# Patient Record
Sex: Female | Born: 1980 | Race: White | Hispanic: No | Marital: Married | Smoking: Never smoker
Health system: Southern US, Community
[De-identification: ages and names within clinical notes are randomized; demographics above are authoritative.]

## PROBLEM LIST (undated history)

## (undated) DIAGNOSIS — IMO0002 Reserved for concepts with insufficient information to code with codable children: Secondary | ICD-10-CM

## (undated) DIAGNOSIS — F329 Major depressive disorder, single episode, unspecified: Secondary | ICD-10-CM

## (undated) DIAGNOSIS — T4145XA Adverse effect of unspecified anesthetic, initial encounter: Secondary | ICD-10-CM

## (undated) DIAGNOSIS — O09529 Supervision of elderly multigravida, unspecified trimester: Secondary | ICD-10-CM

## (undated) DIAGNOSIS — D649 Anemia, unspecified: Secondary | ICD-10-CM

## (undated) DIAGNOSIS — F32A Depression, unspecified: Secondary | ICD-10-CM

---
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
## 1998-03-09 ENCOUNTER — Inpatient Hospital Stay (HOSPITAL_COMMUNITY): Admission: AD | Admit: 1998-03-09 | Discharge: 1998-03-09 | Payer: Self-pay | Admitting: *Deleted

## 1998-03-14 DIAGNOSIS — T8859XA Other complications of anesthesia, initial encounter: Secondary | ICD-10-CM

## 1998-03-14 HISTORY — DX: Other complications of anesthesia, initial encounter: T88.59XA

## 1998-03-16 ENCOUNTER — Inpatient Hospital Stay (HOSPITAL_COMMUNITY): Admission: AD | Admit: 1998-03-16 | Discharge: 1998-03-16 | Payer: Self-pay | Admitting: Obstetrics

## 1998-03-17 ENCOUNTER — Inpatient Hospital Stay (HOSPITAL_COMMUNITY): Admission: AD | Admit: 1998-03-17 | Discharge: 1998-03-22 | Payer: Self-pay | Admitting: Obstetrics & Gynecology

## 1998-03-22 ENCOUNTER — Encounter (HOSPITAL_COMMUNITY): Admission: RE | Admit: 1998-03-22 | Discharge: 1998-06-20 | Payer: Self-pay | Admitting: Obstetrics & Gynecology

## 1998-03-26 ENCOUNTER — Inpatient Hospital Stay (HOSPITAL_COMMUNITY): Admission: AD | Admit: 1998-03-26 | Discharge: 1998-03-26 | Payer: Self-pay | Admitting: Obstetrics & Gynecology

## 2010-09-12 DIAGNOSIS — IMO0002 Reserved for concepts with insufficient information to code with codable children: Secondary | ICD-10-CM

## 2010-09-12 HISTORY — DX: Reserved for concepts with insufficient information to code with codable children: IMO0002

## 2011-03-28 LAB — OB RESULTS CONSOLE ANTIBODY SCREEN
ANTIBODY SCREEN: NEGATIVE
Antibody Screen: NEGATIVE

## 2011-03-28 LAB — OB RESULTS CONSOLE HEPATITIS B SURFACE ANTIGEN: Hepatitis B Surface Ag: NEGATIVE

## 2011-03-28 LAB — OB RESULTS CONSOLE ABO/RH: RH Type: POSITIVE

## 2011-03-28 LAB — OB RESULTS CONSOLE RUBELLA ANTIBODY, IGM: Rubella: IMMUNE

## 2011-10-01 ENCOUNTER — Encounter (HOSPITAL_COMMUNITY): Payer: Self-pay | Admitting: Pharmacist

## 2011-10-05 ENCOUNTER — Encounter (HOSPITAL_COMMUNITY): Payer: Self-pay

## 2011-10-06 ENCOUNTER — Encounter (HOSPITAL_COMMUNITY)
Admission: RE | Admit: 2011-10-06 | Discharge: 2011-10-06 | Disposition: A | Discharge status: Home / Self Care | Payer: Commercial Managed Care - PPO | Source: Ambulatory Visit | Attending: Obstetrics and Gynecology | Admitting: Obstetrics and Gynecology

## 2011-10-06 ENCOUNTER — Encounter (HOSPITAL_COMMUNITY): Payer: Self-pay

## 2011-10-06 HISTORY — DX: Adverse effect of unspecified anesthetic, initial encounter: T41.45XA

## 2011-10-06 HISTORY — DX: Anemia, unspecified: D64.9

## 2011-10-06 LAB — CBC
MCH: 28 pg (ref 26.0–34.0)
MCV: 83.2 fL (ref 78.0–100.0)
Platelets: 209 10*3/uL (ref 150–400)
RDW: 13.2 % (ref 11.5–15.5)

## 2011-10-06 LAB — TYPE AND SCREEN

## 2011-10-06 NOTE — Patient Instructions (Addendum)
Your procedure is scheduled on:10/10/11  Enter through the Main Entrance at :1130 am Pick up desk phone and dial [phone] and inform us of your arrival.  Please call [phone] if you have any problems the morning of surgery.  Remember: Do not eat after midnight:Sunday Do not drink after:9am on Monday  Take these meds the morning of surgery with a sip of water:none  DO NOT wear jewelry, eye make-up, lotion, or dark fingernail polish. Do not shave for 48 hours prior to surgery.  If you are to be admitted after surgery, leave suitcase in car until your room has been assigned. Patients discharged on the day of surgery will not be allowed to drive home.   Remember to use your Hibiclens as instructed.

## 2011-10-07 NOTE — H&P (Addendum)
31 yo G3P1011 @ 39 wks presents for rpt c-section.  Pregnancy uncomplicated.  Past histroy - see hollister All:  Quinolone drugs  AF, VSS Gen - NAD ABd - gravid, NT CV - RRR Lungs - clear bilaterlly PV - cvx closed  A/P:  Prior c-section, desires repeat R/b/a discussed, informed consent

## 2011-10-10 ENCOUNTER — Encounter (HOSPITAL_COMMUNITY): Payer: Self-pay | Admitting: *Deleted

## 2011-10-10 ENCOUNTER — Inpatient Hospital Stay (HOSPITAL_COMMUNITY): Payer: Commercial Managed Care - PPO

## 2011-10-10 ENCOUNTER — Inpatient Hospital Stay (HOSPITAL_COMMUNITY)
Admission: RE | Admit: 2011-10-10 | Discharge: 2011-10-13 | DRG: 766 | Disposition: A | Discharge status: Home / Self Care | Payer: Commercial Managed Care - PPO | Source: Ambulatory Visit | Attending: Obstetrics and Gynecology | Admitting: Obstetrics and Gynecology

## 2011-10-10 ENCOUNTER — Encounter (HOSPITAL_COMMUNITY)
Admission: RE | Disposition: A | Discharge status: Home / Self Care | Payer: Self-pay | Source: Ambulatory Visit | Attending: Obstetrics and Gynecology

## 2011-10-10 ENCOUNTER — Encounter (HOSPITAL_COMMUNITY): Payer: Self-pay

## 2011-10-10 ENCOUNTER — Encounter (HOSPITAL_COMMUNITY): Payer: Self-pay | Admitting: Anesthesiology

## 2011-10-10 DIAGNOSIS — O34219 Maternal care for unspecified type scar from previous cesarean delivery: Principal | ICD-10-CM | POA: Diagnosis present

## 2011-10-10 LAB — TYPE AND SCREEN: Antibody Screen: NEGATIVE

## 2011-10-10 SURGERY — Surgical Case
Anesthesia: Spinal | Site: Abdomen | Wound class: Clean Contaminated

## 2011-10-10 MED ORDER — SIMETHICONE 80 MG PO CHEW
80.0000 mg | CHEWABLE_TABLET | Freq: Three times a day (TID) | ORAL | Status: DC
  Administered 2011-10-10 – 2011-10-13 (×10): 80 mg via ORAL

## 2011-10-10 MED ORDER — SCOPOLAMINE 1 MG/3DAYS TD PT72
1.0000 | MEDICATED_PATCH | Freq: Once | TRANSDERMAL | Status: DC
  Administered 2011-10-10: 1.5 mg via TRANSDERMAL

## 2011-10-10 MED ORDER — OXYCODONE-ACETAMINOPHEN 5-325 MG PO TABS
1.0000 | ORAL_TABLET | ORAL | Status: DC | PRN
  Administered 2011-10-11: 2 via ORAL
  Administered 2011-10-11 – 2011-10-13 (×10): 1 via ORAL
  Filled 2011-10-10 (×9): qty 1
  Filled 2011-10-10: qty 2
  Filled 2011-10-10: qty 1

## 2011-10-10 MED ORDER — OXYTOCIN 10 UNIT/ML IJ SOLN
INTRAMUSCULAR | Status: AC
  Filled 2011-10-10: qty 4

## 2011-10-10 MED ORDER — KETOROLAC TROMETHAMINE 30 MG/ML IJ SOLN: 30.0000 mg | Freq: Four times a day (QID) | INTRAMUSCULAR | Status: AC | PRN

## 2011-10-10 MED ORDER — ONDANSETRON HCL 4 MG/2ML IJ SOLN
INTRAMUSCULAR | Status: AC
  Filled 2011-10-10: qty 2

## 2011-10-10 MED ORDER — MORPHINE SULFATE (PF) 0.5 MG/ML IJ SOLN
INTRAMUSCULAR | Status: DC | PRN
  Administered 2011-10-10: .1 mg via INTRATHECAL

## 2011-10-10 MED ORDER — OXYTOCIN 40 UNITS IN LACTATED RINGERS INFUSION - SIMPLE MED: 62.5000 mL/h | INTRAVENOUS | Status: AC

## 2011-10-10 MED ORDER — SENNOSIDES-DOCUSATE SODIUM 8.6-50 MG PO TABS
2.0000 | ORAL_TABLET | Freq: Every day | ORAL | Status: DC
  Administered 2011-10-10 – 2011-10-12 (×3): 2 via ORAL

## 2011-10-10 MED ORDER — MEDROXYPROGESTERONE ACETATE 150 MG/ML IM SUSP
150.0000 mg | INTRAMUSCULAR | Status: DC | PRN
Start: 2011-10-10 — End: 2011-10-13

## 2011-10-10 MED ORDER — DEXTROSE IN LACTATED RINGERS 5 % IV SOLN
INTRAVENOUS | Status: DC
  Administered 2011-10-10: 23:00:00 via INTRAVENOUS

## 2011-10-10 MED ORDER — SCOPOLAMINE 1 MG/3DAYS TD PT72
MEDICATED_PATCH | TRANSDERMAL | Status: AC
  Administered 2011-10-10: 1.5 mg via TRANSDERMAL
  Filled 2011-10-10: qty 1

## 2011-10-10 MED ORDER — DIPHENHYDRAMINE HCL 25 MG PO CAPS: 25.0000 mg | ORAL_CAPSULE | Freq: Four times a day (QID) | ORAL | Status: DC | PRN

## 2011-10-10 MED ORDER — SIMETHICONE 80 MG PO CHEW
80.0000 mg | CHEWABLE_TABLET | ORAL | Status: DC | PRN
  Administered 2011-10-11: 80 mg via ORAL

## 2011-10-10 MED ORDER — PHENYLEPHRINE HCL 10 MG/ML IJ SOLN
INTRAMUSCULAR | Status: DC | PRN
  Administered 2011-10-10: 40 ug via INTRAVENOUS
  Administered 2011-10-10: 120 ug via INTRAVENOUS

## 2011-10-10 MED ORDER — FENTANYL CITRATE 0.05 MG/ML IJ SOLN
INTRAMUSCULAR | Status: AC
  Filled 2011-10-10: qty 2

## 2011-10-10 MED ORDER — FENTANYL CITRATE 0.05 MG/ML IJ SOLN: 25.0000 ug | INTRAMUSCULAR | Status: DC | PRN

## 2011-10-10 MED ORDER — LACTATED RINGERS IV SOLN
INTRAVENOUS | Status: DC
  Administered 2011-10-10 (×2): via INTRAVENOUS

## 2011-10-10 MED ORDER — MIDAZOLAM HCL 2 MG/2ML IJ SOLN: 0.5000 mg | Freq: Once | INTRAMUSCULAR | Status: DC | PRN

## 2011-10-10 MED ORDER — MEPERIDINE HCL 25 MG/ML IJ SOLN: 6.2500 mg | INTRAMUSCULAR | Status: DC | PRN

## 2011-10-10 MED ORDER — IBUPROFEN 600 MG PO TABS
600.0000 mg | ORAL_TABLET | Freq: Four times a day (QID) | ORAL | Status: DC
  Administered 2011-10-11 – 2011-10-13 (×10): 600 mg via ORAL
  Filled 2011-10-10 (×10): qty 1

## 2011-10-10 MED ORDER — OXYTOCIN 10 UNIT/ML IJ SOLN
40.0000 [IU] | INTRAVENOUS | Status: DC | PRN
  Administered 2011-10-10: 40 [IU] via INTRAVENOUS

## 2011-10-10 MED ORDER — KETOROLAC TROMETHAMINE 30 MG/ML IJ SOLN
INTRAMUSCULAR | Status: AC
  Administered 2011-10-10: 30 mg via INTRAVENOUS
  Filled 2011-10-10: qty 1

## 2011-10-10 MED ORDER — DIPHENHYDRAMINE HCL 50 MG/ML IJ SOLN: 25.0000 mg | INTRAMUSCULAR | Status: DC | PRN

## 2011-10-10 MED ORDER — MENTHOL 3 MG MT LOZG: 1.0000 | LOZENGE | OROMUCOSAL | Status: DC | PRN

## 2011-10-10 MED ORDER — ONDANSETRON HCL 4 MG PO TABS
4.0000 mg | ORAL_TABLET | ORAL | Status: DC | PRN
  Administered 2011-10-11: 4 mg via ORAL
  Filled 2011-10-10: qty 1

## 2011-10-10 MED ORDER — SODIUM CHLORIDE 0.9 % IJ SOLN: 3.0000 mL | INTRAMUSCULAR | Status: DC | PRN

## 2011-10-10 MED ORDER — ACETAMINOPHEN 10 MG/ML IV SOLN: 1000.0000 mg | Freq: Four times a day (QID) | INTRAVENOUS | Status: AC | PRN

## 2011-10-10 MED ORDER — NALBUPHINE HCL 10 MG/ML IJ SOLN
5.0000 mg | INTRAMUSCULAR | Status: DC | PRN
  Administered 2011-10-10: 10 mg via SUBCUTANEOUS

## 2011-10-10 MED ORDER — DIBUCAINE 1 % RE OINT: 1.0000 "application " | TOPICAL_OINTMENT | RECTAL | Status: DC | PRN

## 2011-10-10 MED ORDER — NALOXONE HCL 0.4 MG/ML IJ SOLN: 0.4000 mg | INTRAMUSCULAR | Status: DC | PRN

## 2011-10-10 MED ORDER — PROMETHAZINE HCL 25 MG/ML IJ SOLN: 6.2500 mg | INTRAMUSCULAR | Status: DC | PRN

## 2011-10-10 MED ORDER — MEASLES, MUMPS & RUBELLA VAC ~~LOC~~ INJ: 0.5000 mL | INJECTION | Freq: Once | SUBCUTANEOUS | Status: DC

## 2011-10-10 MED ORDER — WITCH HAZEL-GLYCERIN EX PADS: 1.0000 "application " | MEDICATED_PAD | CUTANEOUS | Status: DC | PRN

## 2011-10-10 MED ORDER — PHENYLEPHRINE 40 MCG/ML (10ML) SYRINGE FOR IV PUSH (FOR BLOOD PRESSURE SUPPORT)
PREFILLED_SYRINGE | INTRAVENOUS | Status: AC
  Filled 2011-10-10: qty 5

## 2011-10-10 MED ORDER — MORPHINE SULFATE 0.5 MG/ML IJ SOLN
INTRAMUSCULAR | Status: AC
  Filled 2011-10-10: qty 10

## 2011-10-10 MED ORDER — METOCLOPRAMIDE HCL 5 MG/ML IJ SOLN: 10.0000 mg | Freq: Three times a day (TID) | INTRAMUSCULAR | Status: DC | PRN

## 2011-10-10 MED ORDER — TETANUS-DIPHTH-ACELL PERTUSSIS 5-2.5-18.5 LF-MCG/0.5 IM SUSP
0.5000 mL | Freq: Once | INTRAMUSCULAR | Status: AC
  Administered 2011-10-11: 0.5 mL via INTRAMUSCULAR
  Filled 2011-10-10: qty 0.5

## 2011-10-10 MED ORDER — LANOLIN HYDROUS EX OINT: 1.0000 "application " | TOPICAL_OINTMENT | CUTANEOUS | Status: DC | PRN

## 2011-10-10 MED ORDER — KETOROLAC TROMETHAMINE 30 MG/ML IJ SOLN
30.0000 mg | Freq: Four times a day (QID) | INTRAMUSCULAR | Status: AC | PRN
  Administered 2011-10-10 (×2): 30 mg via INTRAVENOUS
  Filled 2011-10-10: qty 1

## 2011-10-10 MED ORDER — FENTANYL CITRATE 0.05 MG/ML IJ SOLN
INTRAMUSCULAR | Status: DC | PRN
  Administered 2011-10-10: 25 ug via INTRATHECAL

## 2011-10-10 MED ORDER — CEFAZOLIN SODIUM-DEXTROSE 2-3 GM-% IV SOLR
INTRAVENOUS | Status: AC
  Administered 2011-10-10: 2 g via INTRAVENOUS
  Filled 2011-10-10: qty 50

## 2011-10-10 MED ORDER — DIPHENHYDRAMINE HCL 25 MG PO CAPS: 25.0000 mg | ORAL_CAPSULE | ORAL | Status: DC | PRN

## 2011-10-10 MED ORDER — NALBUPHINE HCL 10 MG/ML IJ SOLN
5.0000 mg | INTRAMUSCULAR | Status: DC | PRN
  Filled 2011-10-10: qty 1

## 2011-10-10 MED ORDER — SCOPOLAMINE 1 MG/3DAYS TD PT72: 1.0000 | MEDICATED_PATCH | Freq: Once | TRANSDERMAL | Status: DC

## 2011-10-10 MED ORDER — CEFAZOLIN SODIUM-DEXTROSE 2-3 GM-% IV SOLR: 2.0000 g | INTRAVENOUS | Status: DC

## 2011-10-10 MED ORDER — SCOPOLAMINE 1 MG/3DAYS TD PT72
MEDICATED_PATCH | TRANSDERMAL | Status: AC
  Filled 2011-10-10: qty 1

## 2011-10-10 MED ORDER — DIPHENHYDRAMINE HCL 50 MG/ML IJ SOLN: 12.5000 mg | INTRAMUSCULAR | Status: DC | PRN

## 2011-10-10 MED ORDER — ONDANSETRON HCL 4 MG/2ML IJ SOLN: 4.0000 mg | INTRAMUSCULAR | Status: DC | PRN

## 2011-10-10 MED ORDER — PRENATAL MULTIVITAMIN CH
1.0000 | ORAL_TABLET | Freq: Every day | ORAL | Status: DC
  Administered 2011-10-11 – 2011-10-13 (×3): 1 via ORAL
  Filled 2011-10-10 (×3): qty 1

## 2011-10-10 MED ORDER — SODIUM CHLORIDE 0.9 % IV SOLN: 1.0000 ug/kg/h | INTRAVENOUS | Status: DC | PRN

## 2011-10-10 MED ORDER — ONDANSETRON HCL 4 MG/2ML IJ SOLN: 4.0000 mg | Freq: Three times a day (TID) | INTRAMUSCULAR | Status: DC | PRN

## 2011-10-10 MED ORDER — ONDANSETRON HCL 4 MG/2ML IJ SOLN
INTRAMUSCULAR | Status: DC | PRN
  Administered 2011-10-10: 4 mg via INTRAVENOUS

## 2011-10-10 MED ORDER — BUPIVACAINE IN DEXTROSE 0.75-8.25 % IT SOLN
INTRATHECAL | Status: DC | PRN
  Administered 2011-10-10: 1.5 mL via INTRATHECAL

## 2011-10-10 SURGICAL SUPPLY — 27 items
CHLORAPREP W/TINT 26ML (MISCELLANEOUS) ×4 IMPLANT
CLOTH BEACON ORANGE TIMEOUT ST (SAFETY) ×2 IMPLANT
DERMABOND ADVANCED (GAUZE/BANDAGES/DRESSINGS) ×1
DERMABOND ADVANCED .7 DNX12 (GAUZE/BANDAGES/DRESSINGS) ×1 IMPLANT
ELECT REM PT RETURN 9FT ADLT (ELECTROSURGICAL) ×2
ELECTRODE REM PT RTRN 9FT ADLT (ELECTROSURGICAL) ×1 IMPLANT
EXTRACTOR VACUUM M CUP 4 TUBE (SUCTIONS) IMPLANT
GLOVE BIO SURGEON STRL SZ 6.5 (GLOVE) ×2 IMPLANT
GLOVE BIOGEL PI IND STRL 7.0 (GLOVE) ×2 IMPLANT
GLOVE BIOGEL PI INDICATOR 7.0 (GLOVE) ×2
GOWN PREVENTION PLUS LG XLONG (DISPOSABLE) ×6 IMPLANT
GOWN STRL REIN XL XLG (GOWN DISPOSABLE) ×2 IMPLANT
KIT ABG SYR 3ML LUER SLIP (SYRINGE) ×2 IMPLANT
NEEDLE HYPO 25X5/8 SAFETYGLIDE (NEEDLE) ×2 IMPLANT
NS IRRIG 1000ML POUR BTL (IV SOLUTION) ×2 IMPLANT
PACK C SECTION WH (CUSTOM PROCEDURE TRAY) ×2 IMPLANT
SLEEVE SCD COMPRESS KNEE MED (MISCELLANEOUS) IMPLANT
STAPLER VISISTAT 35W (STAPLE) IMPLANT
SUT CHROMIC 0 CT 802H (SUTURE) IMPLANT
SUT CHROMIC 0 CTX 36 (SUTURE) ×6 IMPLANT
SUT MON AB-0 CT1 36 (SUTURE) ×2 IMPLANT
SUT PDS AB 0 CTX 60 (SUTURE) ×2 IMPLANT
SUT PLAIN 0 NONE (SUTURE) IMPLANT
SUT VIC AB 4-0 KS 27 (SUTURE) ×2 IMPLANT
TOWEL OR 17X24 6PK STRL BLUE (TOWEL DISPOSABLE) ×4 IMPLANT
TRAY FOLEY CATH 14FR (SET/KITS/TRAYS/PACK) IMPLANT
WATER STERILE IRR 1000ML POUR (IV SOLUTION) ×2 IMPLANT

## 2011-10-10 NOTE — Anesthesia Postprocedure Evaluation (Signed)
Anesthesia Post Note  Patient: Jenna Chung  Procedure(s) Performed: Procedure(s) (LRB): CESAREAN SECTION (N/A)  Anesthesia type: Spinal  Patient location: PACU  Post pain: Pain level controlled  Post assessment: Post-op Vital signs reviewed  Last Vitals:  Filed Vitals:   10/10/11 1545  BP: 96/53  Pulse: 58  Temp:   Resp: 18    Post vital signs: Reviewed  Level of consciousness: awake  Complications: No apparent anesthesia complications

## 2011-10-10 NOTE — Transfer of Care (Signed)
Immediate Anesthesia Transfer of Care Note  Patient: Jenna Chung  Procedure(s) Performed: Procedure(s) (LRB): CESAREAN SECTION (N/A)  Patient Location: PACU  Anesthesia Type: Spinal  Level of Consciousness: awake  Airway & Oxygen Therapy: Patient Spontanous Breathing  Post-op Assessment: Post -op Vital signs reviewed and stable  Post vital signs: Reviewed  Complications: No apparent anesthesia complications

## 2011-10-10 NOTE — Anesthesia Preprocedure Evaluation (Signed)
Anesthesia Evaluation  Patient identified by MRN, date of birth, ID band Patient awake    Reviewed: Allergy & Precautions, H&P , NPO status , Patient's Chart, lab work & pertinent test results  Airway Mallampati: II      Dental No notable dental hx.    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     Plan Discussed with: Anesthesiologist, CRNA and Surgeon  Anesthesia Plan Comments:         Anesthesia Quick Evaluation

## 2011-10-10 NOTE — Anesthesia Procedure Notes (Signed)
Spinal  Patient location during procedure: OR Start time: 10/10/2011 1:22 PM Staffing Anesthesiologist: Brayton Caves R Performed by: anesthesiologist  Preanesthetic Checklist Completed: patient identified, site marked, surgical consent, pre-op evaluation, timeout performed, IV checked, risks and benefits discussed and monitors and equipment checked Spinal Block Patient position: sitting Prep: DuraPrep Patient monitoring: heart rate, cardiac monitor, continuous pulse ox and blood pressure Approach: midline Location: L3-4 Injection technique: single-shot Needle Needle type: Sprotte  Needle gauge: 24 G Needle length: 9 cm Assessment Sensory level: T4 Additional Notes Patient identified.  Risk benefits discussed including failed block, incomplete pain control, headache, nerve damage, paralysis, blood pressure changes, nausea, vomiting, reactions to medication both toxic or allergic, and postpartum back pain.  Patient expressed understanding and wished to proceed.  All questions were answered.  Sterile technique used throughout procedure.  CSF was clear.  No parasthesia or other complications.  Please see nursing notes for vital signs.

## 2011-10-11 ENCOUNTER — Encounter (HOSPITAL_COMMUNITY): Payer: Self-pay | Admitting: Obstetrics and Gynecology

## 2011-10-11 LAB — CBC
HCT: 28.5 % — ABNORMAL LOW (ref 36.0–46.0)
Hemoglobin: 9.6 g/dL — ABNORMAL LOW (ref 12.0–15.0)
RDW: 13.5 % (ref 11.5–15.5)
WBC: 10.5 10*3/uL (ref 4.0–10.5)

## 2011-10-11 NOTE — Op Note (Signed)
NAMETSERING, LEAMAN               ACCOUNT NO.:  [id number]  MEDICAL RECORD NO.:  [id number]  LOCATION:  9106                          FACILITY:  [facility]  PHYSICIAN:  Zelphia Cairo, MD    DATE OF BIRTH:  1981-02-18  DATE OF PROCEDURE: DATE OF DISCHARGE:                              OPERATIVE REPORT   PREOPERATIVE DIAGNOSES: 1. Prior cesarean section, desires repeat. 2. Intrauterine pregnancy at 39+ 4 weeks.  PROCEDURE:  Repeat low transverse cesarean delivery.  SURGEON:  Zelphia Cairo, MD  BLOOD LOSS:  600 mL.  URINE OUTPUT:  Clear.  FINDINGS:  Vigorous female infant with Apgars of 8 and 9.  COMPLICATIONS:  None.  CONDITION:  Stable to recovery room.  ANESTHESIA:  Spinal.  DESCRIPTION OF PROCEDURE:  The patient was taken to the operating room after informed consent was obtained.  She was placed in the supine position with a left tilt after the spinal was placed.  She was prepped and draped in sterile fashion and a Foley catheter was inserted. Pfannenstiel skin incision was made with a scalpel and extended to the underlying fascia.  The fascia was incised in the midline and extended laterally using curved Mayo scissors.  Kocher clamps were used to grasp the superior and inferior portion of the fascia.  The fascia was tented upwards and the underlying rectus muscles were dissected off using sharp and blunt dissection.  Peritoneum was then identified and entered sharply.  This was extended superiorly and inferiorly with good visualization of the bladder.  The bladder blade was then inserted. Vesicouterine peritoneum was dissected off the lower uterine segment using Metzenbaum scissors.  The bladder blade was then repositioned.  Uterine incision was made with a scalpel and extended bluntly using my fingers.  The fetal membranes were ruptured for clear fluid and fetal vertex was brought to the uterine incision.  The mouth and nose were suctioned.  Nuchal cord x1 was reduced.   The shoulders and body then delivered using fundal pressure.  The cord was clamped and cut and the infant was taken to the awaiting pediatric staff.  The placenta was then manually removed from the uterus.  The uterus was cleared of all clots and debris using a dry lap sponge.  The uterine incision was approximated using double-layer closure of 0 chromic in a running, locked fashion.  The pelvis was then copiously irrigated with saline. The fascia was reinspected and again found to be hemostatic.  The peritoneum was closed with 0 Monocryl. The fascia was closed with a loop 0 PDS.  The skin was closed with 4-0 Vicryl and Dermabond was placed over the skin incision.  Sponge lap, needle, and instrument counts were correct x2.  She was taken to the recovery room in stable condition.     Zelphia Cairo, MD     GA/MEDQ  D:  10/10/2011  T:  10/11/2011  Job:  [id number]

## 2011-10-11 NOTE — Progress Notes (Signed)
Subjective: Postpartum Day 1: Cesarean Delivery Patient reports tolerating PO and no problems voiding.    Objective: Vital signs in last 24 hours: Temp:  [97.4 F (36.3 C)-98.4 F (36.9 C)] 97.9 F (36.6 C) (07/30 9604) Pulse Rate:  [55-82] 68  (07/30 0608) Resp:  [14-20] 20  (07/30 0608) BP: (83-113)/(43-67) 96/60 mmHg (07/30 0608) SpO2:  [92 %-100 %] 98 % (07/30 0200) Weight:  [73.483 kg (162 lb)] 73.483 kg (162 lb) (07/29 1700)  Physical Exam:  General: alert and cooperative Lochia: appropriate Uterine Fundus: firm Incision: healing well DVT Evaluation: No evidence of DVT seen on physical exam.   Basename 10/11/11 0602  HGB 9.6*  HCT 28.5*    Assessment/Plan: Status post Cesarean section. Doing well postoperatively.  Continue current care.  Ting Cage G 10/11/2011, 7:55 AM

## 2011-10-11 NOTE — Addendum Note (Signed)
Addendum  created 10/11/11 1610 by Algis Greenhouse, CRNA   Modules edited:Notes Section

## 2011-10-11 NOTE — Anesthesia Postprocedure Evaluation (Signed)
Anesthesia Post Note  Patient: Jenna Chung  Procedure(s) Performed: Procedure(s) (LRB): CESAREAN SECTION (N/A)  Anesthesia type: Epidural  Patient location: Mother/Baby  Post pain: Pain level controlled  Post assessment: Post-op Vital signs reviewed  Last Vitals:  Filed Vitals:   10/11/11 0608  BP: 96/60  Pulse: 68  Temp: 36.6 C  Resp: 20    Post vital signs: Reviewed  Level of consciousness:alert  Complications: No apparent anesthesia complications

## 2011-10-12 NOTE — Progress Notes (Signed)
Patient encouraged to ambulate halls x3 and drink fluids. Pt not distended or uncomfortable.

## 2011-10-12 NOTE — Progress Notes (Signed)
Subjective: Postpartum Day 2: Cesarean Delivery Patient reports tolerating PO, + flatus and no problems voiding.    Objective: Vital signs in last 24 hours: Temp:  [97.8 F (36.6 C)-98.4 F (36.9 C)] 98.2 F (36.8 C) (07/31 0513) Pulse Rate:  [71-78] 76  (07/31 0513) Resp:  [18-20] 18  (07/31 0513) BP: (88-92)/(54-58) 92/54 mmHg (07/31 0513) SpO2:  [97 %-98 %] 98 % (07/30 1352)  Physical Exam:  General: alert and cooperative Lochia: appropriate Uterine Fundus: firm Incision: healing well DVT Evaluation: No evidence of DVT seen on physical exam.   Basename 10/11/11 0602  HGB 9.6*  HCT 28.5*    Assessment/Plan: Status post Cesarean section. Doing well postoperatively.  Continue current care.  CURTIS,CAROL G 10/12/2011, 7:57 AM

## 2011-10-13 MED ORDER — IBUPROFEN 600 MG PO TABS: 600.0000 mg | ORAL_TABLET | Freq: Four times a day (QID) | ORAL | Status: AC

## 2011-10-13 MED ORDER — OXYCODONE-ACETAMINOPHEN 5-325 MG PO TABS: 1.0000 | ORAL_TABLET | ORAL | Status: AC | PRN

## 2011-10-13 NOTE — Discharge Summary (Signed)
Obstetric Discharge Summary Reason for Admission: cesarean section Prenatal Procedures: ultrasound Intrapartum Procedures: cesarean: low cervical, transverse Postpartum Procedures: none Complications-Operative and Postpartum: none Hemoglobin  Date Value Range Status  10/11/2011 9.6* 12.0 - 15.0 g/dL Final     HCT  Date Value Range Status  10/11/2011 28.5* 36.0 - 46.0 % Final    Physical Exam:  General: alert and cooperative Lochia: appropriate Uterine Fundus: firm Incision: healing well DVT Evaluation: No evidence of DVT seen on physical exam.  Discharge Diagnoses: Term Pregnancy-delivered  Discharge Information: Date: 10/13/2011 Activity: pelvic rest Diet: routine Medications: PNV, Ibuprofen and Percocet Condition: stable Instructions: refer to practice specific booklet Discharge to: home   Newborn Data: Live born female  Birth Weight: 7 lb 8.3 oz (3410 g) APGAR: 8, 9  Home with mother.  Eyad Rochford G 10/13/2011, 8:05 AM

## 2011-12-29 ENCOUNTER — Encounter (HOSPITAL_COMMUNITY)
Admission: RE | Admit: 2011-12-29 | Discharge: 2011-12-29 | Disposition: A | Discharge status: Home / Self Care | Payer: Commercial Managed Care - PPO | Source: Ambulatory Visit | Attending: Obstetrics and Gynecology | Admitting: Obstetrics and Gynecology

## 2011-12-29 DIAGNOSIS — O923 Agalactia: Secondary | ICD-10-CM | POA: Insufficient documentation

## 2012-01-29 ENCOUNTER — Encounter (HOSPITAL_COMMUNITY)
Admission: RE | Admit: 2012-01-29 | Discharge: 2012-01-29 | Disposition: A | Discharge status: Home / Self Care | Payer: Commercial Managed Care - PPO | Source: Ambulatory Visit | Attending: Obstetrics and Gynecology | Admitting: Obstetrics and Gynecology

## 2012-01-29 DIAGNOSIS — O923 Agalactia: Secondary | ICD-10-CM | POA: Insufficient documentation

## 2012-02-28 ENCOUNTER — Encounter (HOSPITAL_COMMUNITY)
Admission: RE | Admit: 2012-02-28 | Discharge: 2012-02-28 | Disposition: A | Discharge status: Home / Self Care | Payer: Commercial Managed Care - PPO | Source: Ambulatory Visit | Attending: Obstetrics and Gynecology | Admitting: Obstetrics and Gynecology

## 2012-02-28 DIAGNOSIS — O923 Agalactia: Secondary | ICD-10-CM | POA: Insufficient documentation

## 2012-03-30 ENCOUNTER — Encounter (HOSPITAL_COMMUNITY)
Admission: RE | Admit: 2012-03-30 | Discharge: 2012-03-30 | Disposition: A | Discharge status: Home / Self Care | Payer: Commercial Managed Care - PPO | Source: Ambulatory Visit | Attending: Obstetrics and Gynecology | Admitting: Obstetrics and Gynecology

## 2012-03-30 DIAGNOSIS — O923 Agalactia: Secondary | ICD-10-CM | POA: Insufficient documentation

## 2012-04-30 ENCOUNTER — Encounter (HOSPITAL_COMMUNITY)
Admission: RE | Admit: 2012-04-30 | Discharge: 2012-04-30 | Disposition: A | Discharge status: Home / Self Care | Payer: Commercial Managed Care - PPO | Source: Ambulatory Visit | Attending: Obstetrics and Gynecology | Admitting: Obstetrics and Gynecology

## 2012-04-30 DIAGNOSIS — O923 Agalactia: Secondary | ICD-10-CM | POA: Insufficient documentation

## 2012-05-29 ENCOUNTER — Encounter (HOSPITAL_COMMUNITY)
Admission: RE | Admit: 2012-05-29 | Discharge: 2012-05-29 | Disposition: A | Discharge status: Home / Self Care | Payer: Commercial Managed Care - PPO | Source: Ambulatory Visit | Attending: Obstetrics and Gynecology | Admitting: Obstetrics and Gynecology

## 2012-05-29 DIAGNOSIS — O923 Agalactia: Secondary | ICD-10-CM | POA: Insufficient documentation

## 2012-06-29 ENCOUNTER — Encounter (HOSPITAL_COMMUNITY)
Admission: RE | Admit: 2012-06-29 | Discharge: 2012-06-29 | Disposition: A | Discharge status: Home / Self Care | Payer: Commercial Managed Care - PPO | Source: Ambulatory Visit | Attending: Obstetrics and Gynecology | Admitting: Obstetrics and Gynecology

## 2012-06-29 DIAGNOSIS — O923 Agalactia: Secondary | ICD-10-CM | POA: Insufficient documentation

## 2012-07-30 ENCOUNTER — Encounter (HOSPITAL_COMMUNITY)
Admission: RE | Admit: 2012-07-30 | Discharge: 2012-07-30 | Disposition: A | Discharge status: Home / Self Care | Payer: Commercial Managed Care - PPO | Source: Ambulatory Visit | Attending: Obstetrics and Gynecology | Admitting: Obstetrics and Gynecology

## 2012-07-30 DIAGNOSIS — O923 Agalactia: Secondary | ICD-10-CM | POA: Insufficient documentation

## 2012-08-30 ENCOUNTER — Encounter (HOSPITAL_COMMUNITY)
Admission: RE | Admit: 2012-08-30 | Discharge: 2012-08-30 | Disposition: A | Discharge status: Home / Self Care | Payer: Commercial Managed Care - PPO | Source: Ambulatory Visit | Attending: Obstetrics and Gynecology | Admitting: Obstetrics and Gynecology

## 2012-08-30 DIAGNOSIS — O923 Agalactia: Secondary | ICD-10-CM | POA: Insufficient documentation

## 2012-09-29 ENCOUNTER — Encounter (HOSPITAL_COMMUNITY)
Admission: RE | Admit: 2012-09-29 | Discharge: 2012-09-29 | Disposition: A | Discharge status: Home / Self Care | Payer: Commercial Managed Care - PPO | Source: Ambulatory Visit | Attending: Obstetrics and Gynecology | Admitting: Obstetrics and Gynecology

## 2012-09-29 DIAGNOSIS — O923 Agalactia: Secondary | ICD-10-CM | POA: Insufficient documentation

## 2012-10-30 ENCOUNTER — Encounter (HOSPITAL_COMMUNITY)
Admission: RE | Admit: 2012-10-30 | Discharge: 2012-10-30 | Disposition: A | Discharge status: Home / Self Care | Payer: Commercial Managed Care - PPO | Source: Ambulatory Visit | Attending: Obstetrics and Gynecology | Admitting: Obstetrics and Gynecology

## 2012-10-30 DIAGNOSIS — O923 Agalactia: Secondary | ICD-10-CM | POA: Insufficient documentation

## 2012-11-30 ENCOUNTER — Encounter (HOSPITAL_COMMUNITY)
Admission: RE | Admit: 2012-11-30 | Discharge: 2012-11-30 | Disposition: A | Discharge status: Home / Self Care | Payer: Commercial Managed Care - PPO | Source: Ambulatory Visit | Attending: Obstetrics and Gynecology | Admitting: Obstetrics and Gynecology

## 2012-11-30 DIAGNOSIS — O923 Agalactia: Secondary | ICD-10-CM | POA: Insufficient documentation

## 2012-12-31 ENCOUNTER — Encounter (HOSPITAL_COMMUNITY)
Admission: RE | Admit: 2012-12-31 | Discharge: 2012-12-31 | Disposition: A | Discharge status: Home / Self Care | Payer: Commercial Managed Care - PPO | Source: Ambulatory Visit | Attending: Obstetrics and Gynecology | Admitting: Obstetrics and Gynecology

## 2012-12-31 DIAGNOSIS — O923 Agalactia: Secondary | ICD-10-CM | POA: Insufficient documentation

## 2013-01-31 ENCOUNTER — Encounter (HOSPITAL_COMMUNITY)
Admission: RE | Admit: 2013-01-31 | Discharge: 2013-01-31 | Disposition: A | Discharge status: Home / Self Care | Payer: Commercial Managed Care - PPO | Source: Ambulatory Visit | Attending: Obstetrics and Gynecology | Admitting: Obstetrics and Gynecology

## 2013-01-31 DIAGNOSIS — O923 Agalactia: Secondary | ICD-10-CM | POA: Insufficient documentation

## 2013-02-19 ENCOUNTER — Other Ambulatory Visit: Payer: Self-pay | Admitting: Obstetrics and Gynecology

## 2013-02-19 DIAGNOSIS — N63 Unspecified lump in unspecified breast: Secondary | ICD-10-CM

## 2013-02-20 ENCOUNTER — Other Ambulatory Visit: Payer: Self-pay | Admitting: Obstetrics and Gynecology

## 2013-02-20 DIAGNOSIS — N6322 Unspecified lump in the left breast, upper inner quadrant: Secondary | ICD-10-CM

## 2013-02-28 ENCOUNTER — Other Ambulatory Visit: Payer: Commercial Managed Care - PPO

## 2013-03-01 ENCOUNTER — Other Ambulatory Visit: Payer: Self-pay | Admitting: Obstetrics and Gynecology

## 2013-03-01 ENCOUNTER — Ambulatory Visit
Admission: RE | Admit: 2013-03-01 | Discharge: 2013-03-01 | Disposition: A | Discharge status: Home / Self Care | Payer: Commercial Managed Care - PPO | Source: Ambulatory Visit | Attending: Obstetrics and Gynecology | Admitting: Obstetrics and Gynecology

## 2013-03-01 DIAGNOSIS — N6322 Unspecified lump in the left breast, upper inner quadrant: Secondary | ICD-10-CM

## 2013-03-03 ENCOUNTER — Encounter (HOSPITAL_COMMUNITY)
Admission: RE | Admit: 2013-03-03 | Discharge: 2013-03-03 | Disposition: A | Discharge status: Home / Self Care | Payer: Commercial Managed Care - PPO | Source: Ambulatory Visit | Attending: Obstetrics and Gynecology | Admitting: Obstetrics and Gynecology

## 2013-03-03 DIAGNOSIS — O923 Agalactia: Secondary | ICD-10-CM | POA: Insufficient documentation

## 2013-03-11 LAB — OB RESULTS CONSOLE RUBELLA ANTIBODY, IGM: Rubella: IMMUNE

## 2013-03-11 LAB — OB RESULTS CONSOLE HEPATITIS B SURFACE ANTIGEN: HEP B S AG: NEGATIVE

## 2013-03-11 LAB — OB RESULTS CONSOLE ABO/RH: RH TYPE: POSITIVE

## 2013-03-11 LAB — OB RESULTS CONSOLE HIV ANTIBODY (ROUTINE TESTING): HIV: NONREACTIVE

## 2013-03-11 LAB — OB RESULTS CONSOLE RPR: RPR: NONREACTIVE

## 2013-04-03 ENCOUNTER — Encounter (HOSPITAL_COMMUNITY)
Admission: RE | Admit: 2013-04-03 | Discharge: 2013-04-03 | Disposition: A | Discharge status: Home / Self Care | Payer: PRIVATE HEALTH INSURANCE | Source: Ambulatory Visit | Attending: Obstetrics and Gynecology | Admitting: Obstetrics and Gynecology

## 2013-04-03 DIAGNOSIS — O923 Agalactia: Secondary | ICD-10-CM | POA: Insufficient documentation

## 2013-05-04 ENCOUNTER — Encounter (HOSPITAL_COMMUNITY)
Admission: RE | Admit: 2013-05-04 | Discharge: 2013-05-04 | Disposition: A | Discharge status: Home / Self Care | Payer: PRIVATE HEALTH INSURANCE | Source: Ambulatory Visit | Attending: Obstetrics and Gynecology | Admitting: Obstetrics and Gynecology

## 2013-05-04 DIAGNOSIS — O923 Agalactia: Secondary | ICD-10-CM | POA: Insufficient documentation

## 2013-06-03 ENCOUNTER — Encounter (HOSPITAL_COMMUNITY)
Admission: RE | Admit: 2013-06-03 | Discharge: 2013-06-03 | Disposition: A | Discharge status: Home / Self Care | Payer: PRIVATE HEALTH INSURANCE | Source: Ambulatory Visit | Attending: Obstetrics and Gynecology | Admitting: Obstetrics and Gynecology

## 2013-06-03 DIAGNOSIS — O923 Agalactia: Secondary | ICD-10-CM | POA: Insufficient documentation

## 2013-07-04 ENCOUNTER — Encounter (HOSPITAL_COMMUNITY)
Admission: RE | Admit: 2013-07-04 | Discharge: 2013-07-04 | Disposition: A | Discharge status: Home / Self Care | Payer: PRIVATE HEALTH INSURANCE | Source: Ambulatory Visit | Attending: Obstetrics and Gynecology | Admitting: Obstetrics and Gynecology

## 2013-07-04 DIAGNOSIS — O923 Agalactia: Secondary | ICD-10-CM | POA: Insufficient documentation

## 2013-07-19 LAB — OB RESULTS CONSOLE GBS: STREP GROUP B AG: POSITIVE

## 2013-08-03 ENCOUNTER — Encounter (HOSPITAL_COMMUNITY)
Admission: RE | Admit: 2013-08-03 | Discharge: 2013-08-03 | Disposition: A | Discharge status: Home / Self Care | Payer: PRIVATE HEALTH INSURANCE | Source: Ambulatory Visit | Attending: Obstetrics and Gynecology | Admitting: Obstetrics and Gynecology

## 2013-08-03 DIAGNOSIS — O923 Agalactia: Secondary | ICD-10-CM | POA: Insufficient documentation

## 2013-08-13 ENCOUNTER — Encounter (HOSPITAL_COMMUNITY): Payer: Self-pay | Admitting: Pharmacy Technician

## 2013-08-22 ENCOUNTER — Encounter (HOSPITAL_COMMUNITY): Payer: Self-pay | Admitting: *Deleted

## 2013-08-22 ENCOUNTER — Encounter (HOSPITAL_COMMUNITY): Payer: Self-pay

## 2013-08-22 ENCOUNTER — Encounter (HOSPITAL_COMMUNITY)
Admission: RE | Admit: 2013-08-22 | Discharge: 2013-08-22 | Disposition: A | Discharge status: Home / Self Care | Payer: PRIVATE HEALTH INSURANCE | Source: Ambulatory Visit | Attending: Obstetrics and Gynecology | Admitting: Obstetrics and Gynecology

## 2013-08-22 LAB — CBC
HEMATOCRIT: 31.8 % — AB (ref 36.0–46.0)
Hemoglobin: 10.6 g/dL — ABNORMAL LOW (ref 12.0–15.0)
MCH: 28.6 pg (ref 26.0–34.0)
MCHC: 33.3 g/dL (ref 30.0–36.0)
MCV: 85.7 fL (ref 78.0–100.0)
Platelets: 224 10*3/uL (ref 150–400)
RBC: 3.71 MIL/uL — ABNORMAL LOW (ref 3.87–5.11)
RDW: 13.4 % (ref 11.5–15.5)
WBC: 8.2 10*3/uL (ref 4.0–10.5)

## 2013-08-22 LAB — TYPE AND SCREEN
ABO/RH(D): A POS
Antibody Screen: NEGATIVE

## 2013-08-22 NOTE — H&P (Addendum)
Jenna Chung is a 33 y.o. female presenting for repeat c-section. History OB History   Grav Para Term Preterm Abortions TAB SAB Ect Mult Living   3 2 2  1     2      Past Medical History  Diagnosis Date  . Anemia   . 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 Cesarean section    . Cesarean section  10/10/2011    Procedure: CESAREAN SECTION;  Surgeon: Zelphia Cairo, MD;  Location: [facility];  Service: Gynecology;  Laterality: N/A;  Repeat edc 10/13/11   Family History: family history is not on file. Social History:  reports that she has never smoked. She does not have any smokeless tobacco history on file. She reports that she does not drink alcohol or use illicit drugs.   Prenatal Transfer Tool  Maternal Diabetes: No Genetic Screening: Normal Maternal Ultrasounds/Referrals: Normal Fetal Ultrasounds or other Referrals:  None Maternal Substance Abuse:  No Significant Maternal Medications:  None Significant Maternal Lab Results:  None Other Comments:  None  ROS    Exam Physical Exam  Gen - NAD CV - RRR Lungs - clear Abd - gravid, NT Ext - NT PV - cvx exam deferred Prenatal labs: ABO, Rh:   Antibody:   Rubella:   RPR:    HBsAg:    HIV:    GBS:     Assessment/Plan: Previous c-section x2 Repeat c-section R/b/a discussed, questions answered, informed consent   Saryn Cherry 08/22/2013, 7:22 AM

## 2013-08-22 NOTE — Patient Instructions (Addendum)
   Your procedure is scheduled on:  Friday, June 12  Enter through the [employer] [facility] at: 6:30 AM Pick up the phone at the desk and dial [phone] and inform us of your arrival.  Please call this number if you have any problems the morning of surgery: [phone]  Remember: Do not eat or drink after midnight: Thursday Take these medicines the morning of surgery with a SIP OF WATER:  None  Do not wear jewelry, make-up, or FINGER nail polish No metal in your hair or on your body. Do not wear lotions, powders, perfumes.  You may wear deodorant.  Do not bring valuables to the hospital. Contacts, dentures or bridgework may not be worn into surgery.  Leave suitcase in the car. After Surgery it may be brought to your room. For patients being admitted to the hospital, checkout time is 11:00am the day of discharge.  Home with husband Thayer Ohm cell [phone].

## 2013-08-23 ENCOUNTER — Encounter (HOSPITAL_COMMUNITY)
Admission: RE | Disposition: A | Discharge status: Home / Self Care | Payer: Self-pay | Source: Ambulatory Visit | Attending: Obstetrics and Gynecology

## 2013-08-23 ENCOUNTER — Encounter (HOSPITAL_COMMUNITY): Payer: Self-pay | Admitting: General Practice

## 2013-08-23 ENCOUNTER — Inpatient Hospital Stay (HOSPITAL_COMMUNITY): Payer: PRIVATE HEALTH INSURANCE | Admitting: Anesthesiology

## 2013-08-23 ENCOUNTER — Inpatient Hospital Stay (HOSPITAL_COMMUNITY)
Admission: RE | Admit: 2013-08-23 | Discharge: 2013-08-25 | DRG: 766 | Disposition: A | Discharge status: Home / Self Care | Payer: PRIVATE HEALTH INSURANCE | Source: Ambulatory Visit | Attending: Obstetrics and Gynecology | Admitting: Obstetrics and Gynecology

## 2013-08-23 DIAGNOSIS — Z98891 History of uterine scar from previous surgery: Secondary | ICD-10-CM

## 2013-08-23 DIAGNOSIS — O9989 Other specified diseases and conditions complicating pregnancy, childbirth and the puerperium: Secondary | ICD-10-CM

## 2013-08-23 DIAGNOSIS — Z22330 Carrier of Group B streptococcus: Secondary | ICD-10-CM

## 2013-08-23 DIAGNOSIS — O34219 Maternal care for unspecified type scar from previous cesarean delivery: Principal | ICD-10-CM | POA: Diagnosis present

## 2013-08-23 DIAGNOSIS — O99892 Other specified diseases and conditions complicating childbirth: Secondary | ICD-10-CM | POA: Diagnosis present

## 2013-08-23 HISTORY — DX: Reserved for concepts with insufficient information to code with codable children: IMO0002

## 2013-08-23 HISTORY — DX: Major depressive disorder, single episode, unspecified: F32.9

## 2013-08-23 HISTORY — DX: Depression, unspecified: F32.A

## 2013-08-23 LAB — RPR

## 2013-08-23 SURGERY — Surgical Case
Anesthesia: Spinal | Site: Abdomen

## 2013-08-23 MED ORDER — ONDANSETRON HCL 4 MG/2ML IJ SOLN: 4.0000 mg | INTRAMUSCULAR | Status: DC | PRN

## 2013-08-23 MED ORDER — KETOROLAC TROMETHAMINE 30 MG/ML IJ SOLN
INTRAMUSCULAR | Status: AC
  Administered 2013-08-23: 30 mg via INTRAVENOUS
  Filled 2013-08-23: qty 1

## 2013-08-23 MED ORDER — SCOPOLAMINE 1 MG/3DAYS TD PT72
1.0000 | MEDICATED_PATCH | Freq: Once | TRANSDERMAL | Status: DC
  Filled 2013-08-23: qty 1

## 2013-08-23 MED ORDER — ONDANSETRON HCL 4 MG/2ML IJ SOLN
INTRAMUSCULAR | Status: DC | PRN
  Administered 2013-08-23: 4 mg via INTRAVENOUS

## 2013-08-23 MED ORDER — METOCLOPRAMIDE HCL 5 MG/ML IJ SOLN: 10.0000 mg | Freq: Three times a day (TID) | INTRAMUSCULAR | Status: DC | PRN

## 2013-08-23 MED ORDER — MEPERIDINE HCL 25 MG/ML IJ SOLN: 6.2500 mg | INTRAMUSCULAR | Status: DC | PRN

## 2013-08-23 MED ORDER — DIPHENHYDRAMINE HCL 50 MG/ML IJ SOLN: 25.0000 mg | INTRAMUSCULAR | Status: DC | PRN

## 2013-08-23 MED ORDER — SIMETHICONE 80 MG PO CHEW
80.0000 mg | CHEWABLE_TABLET | Freq: Three times a day (TID) | ORAL | Status: DC
  Administered 2013-08-23 – 2013-08-25 (×5): 80 mg via ORAL
  Filled 2013-08-23 (×5): qty 1

## 2013-08-23 MED ORDER — DIBUCAINE 1 % RE OINT: 1.0000 "application " | TOPICAL_OINTMENT | RECTAL | Status: DC | PRN

## 2013-08-23 MED ORDER — FENTANYL CITRATE 0.05 MG/ML IJ SOLN
INTRAMUSCULAR | Status: DC | PRN
  Administered 2013-08-23: 25 ug via INTRATHECAL

## 2013-08-23 MED ORDER — PHENYLEPHRINE 8 MG IN D5W 100 ML (0.08MG/ML) PREMIX OPTIME
INJECTION | INTRAVENOUS | Status: DC | PRN
  Administered 2013-08-23: 60 ug/min via INTRAVENOUS

## 2013-08-23 MED ORDER — NALOXONE HCL 0.4 MG/ML IJ SOLN: 0.4000 mg | INTRAMUSCULAR | Status: DC | PRN

## 2013-08-23 MED ORDER — CEFAZOLIN SODIUM-DEXTROSE 2-3 GM-% IV SOLR
INTRAVENOUS | Status: AC
  Filled 2013-08-23: qty 50

## 2013-08-23 MED ORDER — SENNOSIDES-DOCUSATE SODIUM 8.6-50 MG PO TABS
2.0000 | ORAL_TABLET | ORAL | Status: DC
  Administered 2013-08-24 – 2013-08-25 (×2): 2 via ORAL
  Filled 2013-08-23 (×2): qty 2

## 2013-08-23 MED ORDER — OXYCODONE-ACETAMINOPHEN 5-325 MG PO TABS
1.0000 | ORAL_TABLET | ORAL | Status: DC | PRN
  Administered 2013-08-24 – 2013-08-25 (×4): 1 via ORAL
  Filled 2013-08-23 (×4): qty 1

## 2013-08-23 MED ORDER — CEFAZOLIN SODIUM-DEXTROSE 2-3 GM-% IV SOLR
2.0000 g | INTRAVENOUS | Status: AC
  Administered 2013-08-23: 2 g via INTRAVENOUS

## 2013-08-23 MED ORDER — DIPHENHYDRAMINE HCL 50 MG/ML IJ SOLN: 12.5000 mg | INTRAMUSCULAR | Status: DC | PRN

## 2013-08-23 MED ORDER — OXYTOCIN 40 UNITS IN LACTATED RINGERS INFUSION - SIMPLE MED: 62.5000 mL/h | INTRAVENOUS | Status: AC

## 2013-08-23 MED ORDER — ONDANSETRON HCL 4 MG/2ML IJ SOLN: 4.0000 mg | Freq: Three times a day (TID) | INTRAMUSCULAR | Status: DC | PRN

## 2013-08-23 MED ORDER — SCOPOLAMINE 1 MG/3DAYS TD PT72
MEDICATED_PATCH | TRANSDERMAL | Status: AC
  Administered 2013-08-23: 1.5 mg via TRANSDERMAL
  Filled 2013-08-23: qty 1

## 2013-08-23 MED ORDER — DIPHENHYDRAMINE HCL 25 MG PO CAPS: 25.0000 mg | ORAL_CAPSULE | Freq: Four times a day (QID) | ORAL | Status: DC | PRN

## 2013-08-23 MED ORDER — FENTANYL CITRATE 0.05 MG/ML IJ SOLN
INTRAMUSCULAR | Status: AC
  Filled 2013-08-23: qty 2

## 2013-08-23 MED ORDER — DEXTROSE IN LACTATED RINGERS 5 % IV SOLN
INTRAVENOUS | Status: DC
  Administered 2013-08-23: 19:00:00 via INTRAVENOUS

## 2013-08-23 MED ORDER — IBUPROFEN 600 MG PO TABS
600.0000 mg | ORAL_TABLET | Freq: Four times a day (QID) | ORAL | Status: DC
  Administered 2013-08-23 – 2013-08-25 (×8): 600 mg via ORAL
  Filled 2013-08-23 (×8): qty 1

## 2013-08-23 MED ORDER — MENTHOL 3 MG MT LOZG: 1.0000 | LOZENGE | OROMUCOSAL | Status: DC | PRN

## 2013-08-23 MED ORDER — ONDANSETRON HCL 4 MG PO TABS
4.0000 mg | ORAL_TABLET | ORAL | Status: DC | PRN
Start: 2013-08-23 — End: 2013-08-25

## 2013-08-23 MED ORDER — NALOXONE HCL 1 MG/ML IJ SOLN
1.0000 ug/kg/h | INTRAVENOUS | Status: DC | PRN
  Filled 2013-08-23: qty 2

## 2013-08-23 MED ORDER — TETANUS-DIPHTH-ACELL PERTUSSIS 5-2.5-18.5 LF-MCG/0.5 IM SUSP: 0.5000 mL | Freq: Once | INTRAMUSCULAR | Status: DC

## 2013-08-23 MED ORDER — NALBUPHINE HCL 10 MG/ML IJ SOLN: 5.0000 mg | INTRAMUSCULAR | Status: DC | PRN

## 2013-08-23 MED ORDER — PRENATAL MULTIVITAMIN CH
1.0000 | ORAL_TABLET | Freq: Every day | ORAL | Status: DC
  Administered 2013-08-24 – 2013-08-25 (×2): 1 via ORAL
  Filled 2013-08-23 (×2): qty 1

## 2013-08-23 MED ORDER — PHENYLEPHRINE HCL 10 MG/ML IJ SOLN
INTRAMUSCULAR | Status: AC
  Filled 2013-08-23: qty 1

## 2013-08-23 MED ORDER — MORPHINE SULFATE (PF) 0.5 MG/ML IJ SOLN
INTRAMUSCULAR | Status: DC | PRN
  Administered 2013-08-23: .15 mg via INTRATHECAL

## 2013-08-23 MED ORDER — KETOROLAC TROMETHAMINE 30 MG/ML IJ SOLN: 30.0000 mg | Freq: Four times a day (QID) | INTRAMUSCULAR | Status: AC | PRN

## 2013-08-23 MED ORDER — MEDROXYPROGESTERONE ACETATE 150 MG/ML IM SUSP: 150.0000 mg | INTRAMUSCULAR | Status: DC | PRN

## 2013-08-23 MED ORDER — WITCH HAZEL-GLYCERIN EX PADS: 1.0000 "application " | MEDICATED_PAD | CUTANEOUS | Status: DC | PRN

## 2013-08-23 MED ORDER — HYDROMORPHONE HCL PF 1 MG/ML IJ SOLN: 0.2500 mg | INTRAMUSCULAR | Status: DC | PRN

## 2013-08-23 MED ORDER — MEASLES, MUMPS & RUBELLA VAC ~~LOC~~ INJ
0.5000 mL | INJECTION | Freq: Once | SUBCUTANEOUS | Status: DC
Start: 2013-08-24 — End: 2013-08-25
  Filled 2013-08-23: qty 0.5

## 2013-08-23 MED ORDER — KETOROLAC TROMETHAMINE 30 MG/ML IJ SOLN
30.0000 mg | Freq: Four times a day (QID) | INTRAMUSCULAR | Status: AC | PRN
  Administered 2013-08-23: 30 mg via INTRAVENOUS

## 2013-08-23 MED ORDER — OXYTOCIN 10 UNIT/ML IJ SOLN
40.0000 [IU] | INTRAVENOUS | Status: DC | PRN
  Administered 2013-08-23: 40 [IU] via INTRAVENOUS

## 2013-08-23 MED ORDER — SCOPOLAMINE 1 MG/3DAYS TD PT72
1.0000 | MEDICATED_PATCH | TRANSDERMAL | Status: DC
  Administered 2013-08-23: 1.5 mg via TRANSDERMAL

## 2013-08-23 MED ORDER — BUPIVACAINE IN DEXTROSE 0.75-8.25 % IT SOLN
INTRATHECAL | Status: DC | PRN
  Administered 2013-08-23: 1.6 mL via INTRATHECAL

## 2013-08-23 MED ORDER — MORPHINE SULFATE 0.5 MG/ML IJ SOLN
INTRAMUSCULAR | Status: AC
  Filled 2013-08-23: qty 10

## 2013-08-23 MED ORDER — SIMETHICONE 80 MG PO CHEW: 80.0000 mg | CHEWABLE_TABLET | ORAL | Status: DC | PRN

## 2013-08-23 MED ORDER — ONDANSETRON HCL 4 MG/2ML IJ SOLN
INTRAMUSCULAR | Status: AC
  Filled 2013-08-23: qty 2

## 2013-08-23 MED ORDER — SIMETHICONE 80 MG PO CHEW
80.0000 mg | CHEWABLE_TABLET | ORAL | Status: DC
  Administered 2013-08-24 – 2013-08-25 (×2): 80 mg via ORAL
  Filled 2013-08-23 (×2): qty 1

## 2013-08-23 MED ORDER — SODIUM CHLORIDE 0.9 % IJ SOLN: 3.0000 mL | INTRAMUSCULAR | Status: DC | PRN

## 2013-08-23 MED ORDER — LANOLIN HYDROUS EX OINT: 1.0000 "application " | TOPICAL_OINTMENT | CUTANEOUS | Status: DC | PRN

## 2013-08-23 MED ORDER — OXYTOCIN 10 UNIT/ML IJ SOLN
INTRAMUSCULAR | Status: AC
  Filled 2013-08-23: qty 4

## 2013-08-23 MED ORDER — LACTATED RINGERS IV SOLN
INTRAVENOUS | Status: DC
  Administered 2013-08-23 (×3): via INTRAVENOUS

## 2013-08-23 MED ORDER — DIPHENHYDRAMINE HCL 25 MG PO CAPS: 25.0000 mg | ORAL_CAPSULE | ORAL | Status: DC | PRN

## 2013-08-23 SURGICAL SUPPLY — 31 items
CLAMP CORD UMBIL (MISCELLANEOUS) IMPLANT
CLOTH BEACON ORANGE TIMEOUT ST (SAFETY) ×3 IMPLANT
DERMABOND ADVANCED (GAUZE/BANDAGES/DRESSINGS) ×2
DERMABOND ADVANCED .7 DNX12 (GAUZE/BANDAGES/DRESSINGS) ×1 IMPLANT
DRAPE LG THREE QUARTER DISP (DRAPES) IMPLANT
DRSG OPSITE POSTOP 4X10 (GAUZE/BANDAGES/DRESSINGS) ×3 IMPLANT
DURAPREP 26ML APPLICATOR (WOUND CARE) ×3 IMPLANT
ELECT REM PT RETURN 9FT ADLT (ELECTROSURGICAL) ×3
ELECTRODE REM PT RTRN 9FT ADLT (ELECTROSURGICAL) ×1 IMPLANT
EXTRACTOR VACUUM M CUP 4 TUBE (SUCTIONS) IMPLANT
EXTRACTOR VACUUM M CUP 4' TUBE (SUCTIONS)
GLOVE BIO SURGEON STRL SZ 6.5 (GLOVE) ×2 IMPLANT
GLOVE BIO SURGEONS STRL SZ 6.5 (GLOVE) ×1
GLOVE BIOGEL PI IND STRL 7.0 (GLOVE) ×1 IMPLANT
GLOVE BIOGEL PI INDICATOR 7.0 (GLOVE) ×2
GOWN STRL REUS W/TWL LRG LVL3 (GOWN DISPOSABLE) ×6 IMPLANT
KIT ABG SYR 3ML LUER SLIP (SYRINGE) IMPLANT
NEEDLE HYPO 25X5/8 SAFETYGLIDE (NEEDLE) IMPLANT
NS IRRIG 1000ML POUR BTL (IV SOLUTION) ×3 IMPLANT
PACK C SECTION WH (CUSTOM PROCEDURE TRAY) ×3 IMPLANT
PAD OB MATERNITY 4.3X12.25 (PERSONAL CARE ITEMS) ×3 IMPLANT
STAPLER VISISTAT 35W (STAPLE) IMPLANT
SUT CHROMIC 0 CT 802H (SUTURE) IMPLANT
SUT CHROMIC 0 CTX 36 (SUTURE) ×6 IMPLANT
SUT MON AB-0 CT1 36 (SUTURE) ×3 IMPLANT
SUT PDS AB 0 CTX 60 (SUTURE) ×3 IMPLANT
SUT PLAIN 0 NONE (SUTURE) IMPLANT
SUT VIC AB 4-0 KS 27 (SUTURE) IMPLANT
TOWEL OR 17X24 6PK STRL BLUE (TOWEL DISPOSABLE) ×3 IMPLANT
TRAY FOLEY CATH 14FR (SET/KITS/TRAYS/PACK) IMPLANT
WATER STERILE IRR 1000ML POUR (IV SOLUTION) IMPLANT

## 2013-08-23 NOTE — Anesthesia Postprocedure Evaluation (Signed)
  Anesthesia Post-op Note  Patient: Jenna Chung  Procedure(s) Performed: Procedure(s) with comments: REPEAT CESAREAN SECTION (N/A) - edc 08/23/13  Patient Location: PACU  Anesthesia Type:Spinal  Level of Consciousness: awake, alert  and oriented  Airway and Oxygen Therapy: Patient Spontanous Breathing  Post-op Pain: none  Post-op Assessment: Post-op Vital signs reviewed, Patient's Cardiovascular Status Stable, Respiratory Function Stable, Patent Airway, No signs of Nausea or vomiting, Pain level controlled, No headache and No backache  Post-op Vital Signs: Reviewed and stable  Last Vitals:  Filed Vitals:   08/23/13 0930  BP: 87/44  Pulse: 56  Temp:   Resp: 22    Complications: No apparent anesthesia complications

## 2013-08-23 NOTE — Op Note (Signed)
Cesarean Section Procedure Note   Aeisha K Bordner  08/23/2013  Indications: Scheduled Proceedure/Maternal Request   Pre-operative Diagnosis: previous X2.   Post-operative Diagnosis: Same   Surgeon: Surgeon(s) and Role:    * Zelphia CairoGretchen Kailand Seda, MD - Primary   Assistants: none  Anesthesia: spinal   Procedure Details:  The patient was seen in the Holding Room. The risks, benefits, complications, treatment options, and expected outcomes were discussed with the patient. The patient concurred with the proposed plan, giving informed consent. identified as Hannie K Bramhall and the procedure verified as C-Section Delivery. A Time Out was held and the above information confirmed.  After induction of anesthesia, the patient was draped and prepped in the usual sterile manner. A transverse was made and carried down through the subcutaneous tissue to the fascia. Fascial incision was made and extended transversely. The fascia was separated from the underlying rectus tissue superiorly and inferiorly. The peritoneum was identified and entered. Peritoneal incision was extended longitudinally. The utero-vesical peritoneal reflection was incised transversely and the bladder flap was bluntly freed from the lower uterine segment. A low transverse uterine incision was made. Delivered from cephalic presentation was avigerous female with Apgar scores of 9 at one minute and 10 at five minutes. Cord ph was sent the umbilical cord was clamped and cut cord blood was obtained for evaluation. The placenta was removed Intact and appeared normal. The uterine outline, tubes and ovaries appeared normal}. The uterine incision was closed with running locked sutures of 0chromic gut.   Hemostasis was observed. Lavage was carried out until clear. The fascia was then reapproximated with running sutures of 0 PDS.  The skin was closed with 4-0Vicryl.   Instrument, sponge, and needle counts were correct prior the abdominal closure and were  correct at the conclusion of the case.     Estimated Blood Loss: 600cc  Urine Output: clear  Specimens: none  Complications: no complications  Disposition: PACU - hemodynamically stable.   Maternal Condition: stable   Baby condition / location:  Couplet care / Skin to Skin  Attending Attestation: I was present and scrubbed for the entire procedure.   Signed: Surgeon(s): Zelphia CairoGretchen Jordany Russett, MD

## 2013-08-23 NOTE — Anesthesia Postprocedure Evaluation (Signed)
  Anesthesia Post-op Note  Patient: Jenna Chung  Procedure(s) Performed: Procedure(s) with comments: REPEAT CESAREAN SECTION (N/A) - edc 08/23/13  Patient Location: PACU and Mother/Baby  Anesthesia Type:Spinal  Level of Consciousness: awake and alert   Airway and Oxygen Therapy: Patient Spontanous Breathing  Post-op Pain: mild  Post-op Assessment: Patient's Cardiovascular Status Stable, Respiratory Function Stable, No signs of Nausea or vomiting, Adequate PO intake, Pain level controlled, No headache, No backache, No residual numbness and No residual motor weakness  Post-op Vital Signs: Reviewed and stable  Last Vitals:  Filed Vitals:   08/23/13 1400  BP: 92/55  Pulse: 62  Temp: 36.4 C  Resp: 20    Complications: No apparent anesthesia complications

## 2013-08-23 NOTE — Transfer of Care (Signed)
Immediate Anesthesia Transfer of Care Note  Patient: Jenna Chung  Procedure(s) Performed: Procedure(s) with comments: REPEAT CESAREAN SECTION (N/A) - edc 08/23/13  Patient Location: PACU  Anesthesia Type:Spinal  Level of Consciousness: awake, alert  and oriented  Airway & Oxygen Therapy: Patient Spontanous Breathing  Post-op Assessment: Report given to PACU RN and Post -op Vital signs reviewed and stable  Post vital signs: Reviewed and stable  Complications: No apparent anesthesia complications

## 2013-08-23 NOTE — Addendum Note (Signed)
Addendum created 08/23/13 [id number] by Shanon PayorSuzanne M Armonie Staten, CRNA   Modules edited: Anesthesia Review and Sign Navigator Section

## 2013-08-23 NOTE — Addendum Note (Signed)
Addendum created 08/23/13 1032 by Shanon PayorSuzanne M Elinora Weigand, CRNA   Modules edited: Anesthesia Medication Administration

## 2013-08-23 NOTE — Consult Note (Signed)
Neonatology Note:   Attendance at C-section:    I was asked by Dr. Renaldo FiddlerAdkins to attend this repeat C/S at term. The mother is a G4P2A1  A pos, GBS pos with a history of depression and otherwise uncomplicated pregnancy. ROM at delivery, fluid clear. Infant vigorous with good spontaneous cry and tone. Needed only minimal bulb suctioning. Ap 9/10. Lungs clear to ausc in DR. To CN to care of Pediatrician.   Doretha Souhristie C. Darrall Strey, MD

## 2013-08-23 NOTE — Anesthesia Preprocedure Evaluation (Signed)

## 2013-08-23 NOTE — Addendum Note (Signed)
Addendum created 08/23/13 1452 by Elbert Ewingsolleen S Hurschel Paynter, CRNA   Modules edited: Charges VN, Notes Section   Notes Section:  File: [id number]

## 2013-08-23 NOTE — Anesthesia Procedure Notes (Signed)
Spinal  Patient location during procedure: OR Start time: 08/23/2013 7:33 AM Staffing Anesthesiologist: Omaya Nieland A. Performed by: anesthesiologist  Preanesthetic Checklist Completed: patient identified, site marked, surgical consent, pre-op evaluation, timeout performed, IV checked, risks and benefits discussed and monitors and equipment checked Spinal Block Patient position: sitting Prep: site prepped and draped and DuraPrep Patient monitoring: heart rate, cardiac monitor, continuous pulse ox and blood pressure Approach: midline Location: L4-5 Injection technique: single-shot Needle Needle type: Sprotte  Needle gauge: 24 G Needle length: 9 cm Needle insertion depth: 5.5 cm Assessment Sensory level: T4 Additional Notes Patient tolerated procedure well. Adequate sensory level.

## 2013-08-23 NOTE — Lactation Note (Signed)
This note was copied from the chart of Jenna Chung. Lactation Consultation Note  Patient Name: Jenna Chung Today's Date: 08/23/2013 Reason for consult: Initial assessment  Pt asleep upon entering room and as LC was speaking with husband mom awoke.  Mom reports Peds wife Jenna Chung had been in today to see her regarding breastfeeding.  Pt reports infant is breastfeeding well and has no complaints.  Denies any pain.  Infant GA 40.0; BW-6 lbs, 15 oz. Born by C-Section; EBL - 600 ml.  Infant has breastfed x4 (11-30 min) + 2 attempts with LS-8 by RN; voids-3; stools-3 documented.  Patient is a P3 with experience breastfeeding 6 months - 1 year with previous children.  Mom very sleepy so did not get to speak with mom too long.  Lactation brochure given and informed of outpatient services and hospital support group.  Encouraged to call for assistance if needed.     Maternal Data Formula Feeding for Exclusion: No Infant to breast within first hour of birth: Yes Does the patient have breastfeeding experience prior to this delivery?: Yes  Feeding Feeding Type: Breast Milk  LATCH Score/Interventions                      Lactation Tools Discussed/Used WIC Program: No   Consult Status Consult Status: Follow-up Date: 08/24/13 Follow-up type: In-patient    Jenna Chung, Jenna Chung 08/23/2013, 11:28 PM

## 2013-08-24 LAB — CBC
HEMATOCRIT: 27.7 % — AB (ref 36.0–46.0)
Hemoglobin: 9.4 g/dL — ABNORMAL LOW (ref 12.0–15.0)
MCH: 28.9 pg (ref 26.0–34.0)
MCHC: 33.9 g/dL (ref 30.0–36.0)
MCV: 85.2 fL (ref 78.0–100.0)
PLATELETS: 176 10*3/uL (ref 150–400)
RBC: 3.25 MIL/uL — AB (ref 3.87–5.11)
RDW: 13.4 % (ref 11.5–15.5)
WBC: 12.6 10*3/uL — ABNORMAL HIGH (ref 4.0–10.5)

## 2013-08-24 LAB — BIRTH TISSUE RECOVERY COLLECTION (PLACENTA DONATION)

## 2013-08-24 NOTE — Lactation Note (Signed)
This note was copied from the chart of Jenna Chung. Lactation Consultation Note Follow up visit at 33 hours of age.  Mom reports baby is doing well, she is able to hand express colostrum and mentions a little nipple soreness and is using a natural nipple butter with olive oil.  Encouraged mom to 1st express colostrum and rub into nipples.  Discussed position changes as mom was using cradle hold and baby doesn't always open mouth wide.Pecola Leisure.  Baby has had 8 feedings, 5 voids and 6 stools in past 24 hours with latch scores of "9".  Mom is receptive to breastfeeding basic information and discussed how she can request a DEBP from insurance.  Mom to call for assist as needed.    Patient Name: Jenna Inge RiseHessie Reh VHQIO'NToday's Date: 08/24/2013 Reason for consult: Follow-up assessment   Maternal Data    Feeding Feeding Type: Breast Fed Length of feed: 15 min  LATCH Score/Interventions                Intervention(s): Breastfeeding basics reviewed;Support Pillows;Position options     Lactation Tools Discussed/Used     Consult Status Consult Status: Follow-up Date: 08/25/13 Follow-up type: In-patient    Jannifer RodneyShoptaw, Ferrin Liebig Lynn 08/24/2013, 5:42 PM

## 2013-08-24 NOTE — Progress Notes (Signed)
Clinical Social Work Department PSYCHOSOCIAL ASSESSMENT - MATERNAL/CHILD 08/24/2013  Patient:  Jenna Chung,Jenna Chung  Account Number:  [id number]  Admit Date:  08/23/2013  Marjo Bickerhilds Name:   Jenna DancerUndecided at time of assessment    Clinical Social Worker:  Any Mcneice, LCSW   Date/Time:  08/24/2013 01:00 PM  Date Referred:  08/23/2013   Referral source  Central Nursery     Referred reason  Depression/Anxiety   Other referral source:    I:  FAMILY / HOME ENVIRONMENT Child's legal guardian:  PARENT  Guardian - Name Guardian - Age Guardian - Address  Jenna Chung,Jenna Chung 32   Goth, Corporate treasurerChristopher     Other household support members/support persons Other support:    II  PSYCHOSOCIAL DATA Information Source:    Event organiserinancial and Community Resources Employment:   Both parents employed   Surveyor, quantityinancial resources:  Media plannerrivate Insurance If [employer]icaid - [place]:    School / Grade:   Maternity Care Coordinator / Child Services Coordination / Early Interventions:  Cultural issues impacting care:    III  STRENGTHS Strengths  Supportive family/friends  Home prepared for Child (including basic supplies)  Adequate Resources   Strength comment:    IV  RISK FACTORS AND CURRENT PROBLEMS Current Problem:       V  SOCIAL WORK ASSESSMENT Acknowledged order for Social Work consult to assess mother's history of PP Depression.  Parents are married. They have 2 other dependents ages 7015 and 2.  Spouse was present and very attentive to mother and newborn.  Mother state that she experienced PP Depression after the birth of her 35 year old son, but was never treated with medication. She denies currently symptoms of depression.  She reports extensive family support.  No acute social concerns related at this time.    Parents informed of social work Surveyor, miningavailability.      VI SOCIAL WORK PLAN Social Work Plan  No Further Intervention Required / No Barriers to Discharge

## 2013-08-24 NOTE — Progress Notes (Signed)
Subjective: Postpartum Day 1: Cesarean Delivery Patient reports tolerating PO.    Objective: Vital signs in last 24 hours: Temp:  [97.3 F (36.3 C)-98.4 F (36.9 C)] 98 F (36.7 C) (06/13 0431) Pulse Rate:  [52-72] 68 (06/13 0431) Resp:  [17-22] 18 (06/13 0910) BP: (86-102)/(44-64) 96/56 mmHg (06/13 0431) SpO2:  [96 %-100 %] 97 % (06/13 0431) Weight:  [162 lb 14.7 oz (73.9 kg)] 162 lb 14.7 oz (73.9 kg) (06/12 1050)  Physical Exam:  General: alert Lochia: appropriate Uterine Fundus: firm Incision: healing well DVT Evaluation: No evidence of DVT seen on physical exam.   Recent Labs  08/22/13 1522 08/24/13 0545  HGB 10.6* 9.4*  HCT 31.8* 27.7*    Assessment/Plan: Status post Cesarean section. Doing well postoperatively.  Continue current care.  Blaiden Werth M 08/24/2013, 9:12 AM

## 2013-08-25 MED ORDER — OXYCODONE-ACETAMINOPHEN 5-325 MG PO TABS
1.0000 | ORAL_TABLET | Freq: Four times a day (QID) | ORAL | Status: DC | PRN
Start: 2013-08-25 — End: 2016-01-09

## 2013-08-25 MED ORDER — IBUPROFEN 600 MG PO TABS: 600.0000 mg | ORAL_TABLET | Freq: Four times a day (QID) | ORAL | Status: DC | PRN

## 2013-08-25 NOTE — Discharge Summary (Signed)
Obstetric Discharge Summary Reason for Admission: cesarean section Prenatal Procedures: ultrasound Intrapartum Procedures: cesarean: low cervical, transverse Postpartum Procedures: none Complications-Operative and Postpartum: none Hemoglobin  Date Value Ref Range Status  08/24/2013 9.4* 12.0 - 15.0 g/dL Final     HCT  Date Value Ref Range Status  08/24/2013 27.7* 36.0 - 46.0 % Final    Physical Exam:  General: alert, cooperative and no distress Lochia: appropriate Uterine Fundus: firm Incision: healing well DVT Evaluation: No evidence of DVT seen on physical exam.  Discharge Diagnoses: Term Pregnancy-delivered  Discharge Information: Date: 08/25/2013 Activity: pelvic rest Diet: routine Medications: PNV, Ibuprofen and Percocet Condition: stable Instructions: refer to practice specific booklet Discharge to: home   Newborn Data: Live born female  Birth Weight: 6 lb 15.6 oz (3165 g) APGAR: 9, 10  Home with mother.  Jenna Chung,Viraj Liby E 08/25/2013, 8:38 AM

## 2013-08-25 NOTE — Progress Notes (Signed)
Subjective: Postpartum Day 2: Cesarean Delivery Patient reports tolerating PO, + flatus and no problems voiding.   Wants to go home this afternoon Objective: Vital signs in last 24 hours: Temp:  [97.4 F (36.3 C)-98.1 F (36.7 C)] 98.1 F (36.7 C) (06/14 0630) Pulse Rate:  [62-80] 80 (06/14 0630) Resp:  [18] 18 (06/14 0630) BP: (91-93)/(53-57) 92/57 mmHg (06/14 0630)  Physical Exam:  General: alert, cooperative and no distress Lochia: appropriate Uterine Fundus: firm Incision: healing well DVT Evaluation: No evidence of DVT seen on physical exam.   Recent Labs  08/22/13 1522 08/24/13 0545  HGB 10.6* 9.4*  HCT 31.8* 27.7*    Assessment/Plan: Status post Cesarean section. Doing well postoperatively.  Discharge home with standard precautions and return to clinic in 4-6 weeks.  Shir Bergman II,Tiphany Fayson E 08/25/2013, 8:36 AM

## 2013-08-26 ENCOUNTER — Encounter (HOSPITAL_COMMUNITY): Payer: Self-pay | Admitting: Obstetrics and Gynecology

## 2013-09-03 ENCOUNTER — Encounter (HOSPITAL_COMMUNITY)
Admission: RE | Admit: 2013-09-03 | Discharge: 2013-09-03 | Disposition: A | Discharge status: Home / Self Care | Payer: PRIVATE HEALTH INSURANCE | Source: Ambulatory Visit | Attending: Obstetrics and Gynecology | Admitting: Obstetrics and Gynecology

## 2013-09-03 DIAGNOSIS — O923 Agalactia: Secondary | ICD-10-CM | POA: Insufficient documentation

## 2014-01-13 ENCOUNTER — Encounter (HOSPITAL_COMMUNITY): Payer: Self-pay | Admitting: Obstetrics and Gynecology

## 2015-07-30 LAB — OB RESULTS CONSOLE ANTIBODY SCREEN: ANTIBODY SCREEN: NEGATIVE

## 2015-07-30 LAB — OB RESULTS CONSOLE ABO/RH: RH Type: POSITIVE

## 2015-07-30 LAB — OB RESULTS CONSOLE RUBELLA ANTIBODY, IGM: Rubella: IMMUNE

## 2015-07-30 LAB — OB RESULTS CONSOLE HEPATITIS B SURFACE ANTIGEN: Hepatitis B Surface Ag: NEGATIVE

## 2015-07-30 LAB — OB RESULTS CONSOLE GC/CHLAMYDIA
CHLAMYDIA, DNA PROBE: NEGATIVE
GC PROBE AMP, GENITAL: NEGATIVE

## 2015-07-30 LAB — OB RESULTS CONSOLE RPR: RPR: NONREACTIVE

## 2015-07-30 LAB — OB RESULTS CONSOLE HIV ANTIBODY (ROUTINE TESTING): HIV: NONREACTIVE

## 2015-12-29 ENCOUNTER — Encounter (HOSPITAL_COMMUNITY): Payer: Self-pay

## 2015-12-29 LAB — OB RESULTS CONSOLE GBS: GBS: NEGATIVE

## 2015-12-30 ENCOUNTER — Telehealth (HOSPITAL_COMMUNITY): Payer: Self-pay | Admitting: *Deleted

## 2015-12-30 NOTE — Telephone Encounter (Signed)
Preadmission screen  

## 2015-12-31 ENCOUNTER — Telehealth (HOSPITAL_COMMUNITY): Payer: Self-pay | Admitting: *Deleted

## 2015-12-31 NOTE — Telephone Encounter (Signed)
Preadmission screen  

## 2016-01-01 ENCOUNTER — Encounter (HOSPITAL_COMMUNITY): Payer: Self-pay

## 2016-01-05 ENCOUNTER — Encounter (HOSPITAL_COMMUNITY)
Admission: RE | Admit: 2016-01-05 | Discharge: 2016-01-05 | Disposition: A | Discharge status: Home / Self Care | Payer: PRIVATE HEALTH INSURANCE | Source: Ambulatory Visit | Attending: Obstetrics and Gynecology | Admitting: Obstetrics and Gynecology

## 2016-01-05 LAB — CBC
HCT: 32.7 % — ABNORMAL LOW (ref 36.0–46.0)
HEMOGLOBIN: 11.2 g/dL — AB (ref 12.0–15.0)
MCH: 28.6 pg (ref 26.0–34.0)
MCHC: 34.3 g/dL (ref 30.0–36.0)
MCV: 83.6 fL (ref 78.0–100.0)
Platelets: 208 10*3/uL (ref 150–400)
RBC: 3.91 MIL/uL (ref 3.87–5.11)
RDW: 14.2 % (ref 11.5–15.5)
WBC: 7.7 10*3/uL (ref 4.0–10.5)

## 2016-01-05 LAB — TYPE AND SCREEN
ABO/RH(D): A POS
Antibody Screen: NEGATIVE

## 2016-01-05 NOTE — Patient Instructions (Signed)
18 Jenna Chung  01/05/2016   Your procedure is scheduled on:  01/07/2016  [facility].  Pick up the phone at the desk and dial [phone].   Call this number if you have problems the morning of surgery: [phone]   Remember:   Do not eat food:After Midnight.  Do not drink clear liquids: After Midnight.  Take these medicines the morning of surgery with A SIP OF WATER: none   Do not wear jewelry, make-up or nail polish.  Do not wear lotions, powders, or perfumes. Do not wear deodorant.  Do not shave 48 hours prior to surgery.  Do not bring valuables to the hospital.  [facility] is not   responsible for any belongings or valuables brought to the hospital.  Contacts, dentures or bridgework may not be worn into surgery.  Leave suitcase in the car. After surgery it may be brought to your room.  For patients admitted to the hospital, checkout time is 11:00 AM the day of              discharge.   Patients discharged the day of surgery will not be allowed to drive             home.  Name and phone number of your driver: na  Special Instructions:   N/A   Please read over the following fact sheets that you were given:   Surgical Site Infection Prevention

## 2016-01-06 ENCOUNTER — Encounter (HOSPITAL_COMMUNITY)
Admission: RE | Admit: 2016-01-06 | Discharge: 2016-01-06 | Disposition: A | Discharge status: Home / Self Care | Payer: PRIVATE HEALTH INSURANCE | Source: Ambulatory Visit | Attending: Obstetrics and Gynecology | Admitting: Obstetrics and Gynecology

## 2016-01-06 HISTORY — DX: Supervision of elderly multigravida, unspecified trimester: O09.529

## 2016-01-06 LAB — RPR: RPR Ser Ql: NONREACTIVE

## 2016-01-06 NOTE — Anesthesia Preprocedure Evaluation (Signed)
Anesthesia Evaluation  Patient identified by MRN, date of birth, ID band Patient awake    Reviewed: Allergy & Precautions, H&P , Patient's Chart, lab work & pertinent test results  Airway Mallampati: II  TM Distance: >3 FB Neck ROM: full    Dental no notable dental hx.    Pulmonary    Pulmonary exam normal breath sounds clear to auscultation       Cardiovascular Exercise Tolerance: Good  Rhythm:regular Rate:Normal     Neuro/Psych    GI/Hepatic   Endo/Other    Renal/GU      Musculoskeletal   Abdominal   Peds  Hematology   Anesthesia Other Findings  Complication of anesthesia 2000......Marland Kitchen. 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   Dental Advisory Given  Plan Discussed with: CRNA  Anesthesia Plan Comments: (Lab work confirmed with CRNA in room. Platelets okay. Discussed spinal anesthetic, and patient consents to the procedure:  included risk of possible headache,backache, failed block, allergic reaction, and nerve injury. This patient was asked if she had any questions or concerns before the procedure started. )        Anesthesia Quick Evaluation

## 2016-01-06 NOTE — H&P (Addendum)
Jenna Chung is a 35 y.o. female presenting for repeat c-section and sterilization.  Pregnancy uncomplicated.    OB History    Gravida Para Term Preterm AB Living   5 3 3   1 3    SAB TAB Ectopic Multiple Live Births     0     3     Past Medical History:  Diagnosis Date  . AMA (advanced maternal age) multigravida 35+   . Anemia   . 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 Depression    Resolved -History PP Depression 2012 preg no meds  . Termination of pregnancy 7/12   Misoprostol   Past Surgical History:  Procedure Laterality Date  . CESAREAN SECTION  03/1998   [facility]  . CESAREAN SECTION  10/10/2011   Procedure: CESAREAN SECTION;  Surgeon: Jenna CairoGretchen Claretha Townshend, MD;  Location: [facility];  Service: Gynecology;  Laterality: N/A;  Repeat edc 10/13/11  . CESAREAN SECTION N/A 08/23/2013   Procedure: REPEAT CESAREAN SECTION;  Surgeon: Jenna CairoGretchen Bonnie Overdorf, MD;  Location: [facility]S;  Service: Obstetrics;  Laterality: N/A;  edc 08/23/13   Family History: family history includes Cancer in her father; Hypertension in her father. Social History:  reports that she has never smoked. She has never used smokeless tobacco. She reports that she does not drink alcohol or use drugs.     Maternal Diabetes: No Genetic Screening: Normal Maternal Ultrasounds/Referrals: Normal Fetal Ultrasounds or other Referrals:  no Maternal Substance Abuse:  no Significant Maternal Medications:  no Significant Maternal Lab Results: no   ROS History   Last menstrual period 04/09/2015, unknown if currently breastfeeding. Exam Physical Exam AF, vss Gen - NAD ABd - gravid, NT Ext - NT, no edema CV - RRR Lungs - clear Cvx - deferred Prenatal labs: ABO, Rh: --/--/A POS (10/24 1135) Antibody: NEG (10/24 1135) Rubella: Immune (05/18 0000) RPR: Non Reactive (10/24 1135)  HBsAg: Negative (05/18 0000)  HIV: Non-reactive (05/18 0000)  GBS: Negative (10/17 0000)   Assessment/Plan: Previous  c-section x 3 and pt desires sterilization Plan for rpt c-section and BPS R/b/a discussed, questions answered, informed consent  Jenna Chung 01/06/2016, 7:51 PM

## 2016-01-07 ENCOUNTER — Encounter (HOSPITAL_COMMUNITY)
Admission: RE | Disposition: A | Discharge status: Home / Self Care | Payer: Self-pay | Source: Ambulatory Visit | Attending: Obstetrics and Gynecology

## 2016-01-07 ENCOUNTER — Inpatient Hospital Stay (HOSPITAL_COMMUNITY)
Admission: RE | Admit: 2016-01-07 | Discharge: 2016-01-09 | DRG: 766 | Disposition: A | Discharge status: Home / Self Care | Payer: PRIVATE HEALTH INSURANCE | Source: Ambulatory Visit | Attending: Obstetrics and Gynecology | Admitting: Obstetrics and Gynecology

## 2016-01-07 ENCOUNTER — Inpatient Hospital Stay (HOSPITAL_COMMUNITY): Payer: PRIVATE HEALTH INSURANCE | Admitting: Anesthesiology

## 2016-01-07 ENCOUNTER — Encounter (HOSPITAL_COMMUNITY): Payer: Self-pay

## 2016-01-07 DIAGNOSIS — Z302 Encounter for sterilization: Secondary | ICD-10-CM

## 2016-01-07 DIAGNOSIS — O34211 Maternal care for low transverse scar from previous cesarean delivery: Secondary | ICD-10-CM | POA: Diagnosis present

## 2016-01-07 DIAGNOSIS — Z8249 Family history of ischemic heart disease and other diseases of the circulatory system: Secondary | ICD-10-CM

## 2016-01-07 DIAGNOSIS — Z3A39 39 weeks gestation of pregnancy: Secondary | ICD-10-CM | POA: Diagnosis not present

## 2016-01-07 DIAGNOSIS — Z98891 History of uterine scar from previous surgery: Secondary | ICD-10-CM

## 2016-01-07 SURGERY — Surgical Case
Anesthesia: Epidural | Laterality: Bilateral

## 2016-01-07 MED ORDER — WITCH HAZEL-GLYCERIN EX PADS: 1.0000 "application " | MEDICATED_PAD | CUTANEOUS | Status: DC | PRN

## 2016-01-07 MED ORDER — MENTHOL 3 MG MT LOZG: 1.0000 | LOZENGE | OROMUCOSAL | Status: DC | PRN

## 2016-01-07 MED ORDER — ACETAMINOPHEN 500 MG PO TABS
1000.0000 mg | ORAL_TABLET | Freq: Four times a day (QID) | ORAL | Status: AC
  Administered 2016-01-07 – 2016-01-08 (×3): 1000 mg via ORAL
  Filled 2016-01-07 (×4): qty 2

## 2016-01-07 MED ORDER — COCONUT OIL OIL
1.0000 "application " | TOPICAL_OIL | Status: DC | PRN
  Filled 2016-01-07: qty 120

## 2016-01-07 MED ORDER — ACETAMINOPHEN 325 MG PO TABS: 650.0000 mg | ORAL_TABLET | ORAL | Status: DC | PRN

## 2016-01-07 MED ORDER — DEXAMETHASONE SODIUM PHOSPHATE 4 MG/ML IJ SOLN
INTRAMUSCULAR | Status: DC | PRN
  Administered 2016-01-07: 4 mg via INTRAVENOUS

## 2016-01-07 MED ORDER — ONDANSETRON HCL 4 MG/2ML IJ SOLN
INTRAMUSCULAR | Status: DC | PRN
  Administered 2016-01-07: 4 mg via INTRAVENOUS

## 2016-01-07 MED ORDER — DEXTROSE IN LACTATED RINGERS 5 % IV SOLN
INTRAVENOUS | Status: DC
  Administered 2016-01-07: 125 mL/h via INTRAVENOUS

## 2016-01-07 MED ORDER — MEASLES, MUMPS & RUBELLA VAC ~~LOC~~ INJ
0.5000 mL | INJECTION | Freq: Once | SUBCUTANEOUS | Status: DC
  Filled 2016-01-07: qty 0.5

## 2016-01-07 MED ORDER — SIMETHICONE 80 MG PO CHEW
80.0000 mg | CHEWABLE_TABLET | ORAL | Status: DC | PRN
  Administered 2016-01-08 (×2): 80 mg via ORAL

## 2016-01-07 MED ORDER — OXYTOCIN 40 UNITS IN LACTATED RINGERS INFUSION - SIMPLE MED: 2.5000 [IU]/h | INTRAVENOUS | Status: AC

## 2016-01-07 MED ORDER — NALBUPHINE HCL 10 MG/ML IJ SOLN: 5.0000 mg | Freq: Once | INTRAMUSCULAR | Status: DC | PRN

## 2016-01-07 MED ORDER — PHENYLEPHRINE 8 MG IN D5W 100 ML (0.08MG/ML) PREMIX OPTIME
INJECTION | INTRAVENOUS | Status: AC
  Filled 2016-01-07: qty 100

## 2016-01-07 MED ORDER — LACTATED RINGERS IV SOLN
INTRAVENOUS | Status: DC
  Administered 2016-01-07 (×2): via INTRAVENOUS

## 2016-01-07 MED ORDER — ONDANSETRON HCL 4 MG/2ML IJ SOLN: 4.0000 mg | Freq: Three times a day (TID) | INTRAMUSCULAR | Status: DC | PRN

## 2016-01-07 MED ORDER — SENNOSIDES-DOCUSATE SODIUM 8.6-50 MG PO TABS
2.0000 | ORAL_TABLET | ORAL | Status: DC
  Administered 2016-01-08: 2 via ORAL
  Filled 2016-01-07 (×2): qty 2

## 2016-01-07 MED ORDER — ONDANSETRON HCL 4 MG/2ML IJ SOLN
INTRAMUSCULAR | Status: AC
  Filled 2016-01-07: qty 2

## 2016-01-07 MED ORDER — SCOPOLAMINE 1 MG/3DAYS TD PT72
MEDICATED_PATCH | TRANSDERMAL | Status: DC
Start: 2016-01-07 — End: 2016-01-09
  Administered 2016-01-07: 1.5 mg via TRANSDERMAL
  Filled 2016-01-07: qty 1

## 2016-01-07 MED ORDER — SIMETHICONE 80 MG PO CHEW
80.0000 mg | CHEWABLE_TABLET | ORAL | Status: DC
  Administered 2016-01-08: 80 mg via ORAL
  Filled 2016-01-07: qty 1

## 2016-01-07 MED ORDER — DIBUCAINE 1 % RE OINT: 1.0000 "application " | TOPICAL_OINTMENT | RECTAL | Status: DC | PRN

## 2016-01-07 MED ORDER — HYDROMORPHONE HCL 1 MG/ML IJ SOLN: 0.2500 mg | INTRAMUSCULAR | Status: DC | PRN

## 2016-01-07 MED ORDER — DEXAMETHASONE SODIUM PHOSPHATE 4 MG/ML IJ SOLN
INTRAMUSCULAR | Status: AC
  Filled 2016-01-07: qty 1

## 2016-01-07 MED ORDER — PHENYLEPHRINE 8 MG IN D5W 100 ML (0.08MG/ML) PREMIX OPTIME
INJECTION | INTRAVENOUS | Status: DC | PRN
  Administered 2016-01-07: 60 ug/min via INTRAVENOUS

## 2016-01-07 MED ORDER — NALOXONE HCL 0.4 MG/ML IJ SOLN: 0.4000 mg | INTRAMUSCULAR | Status: DC | PRN

## 2016-01-07 MED ORDER — DIPHENHYDRAMINE HCL 50 MG/ML IJ SOLN: 12.5000 mg | INTRAMUSCULAR | Status: DC | PRN

## 2016-01-07 MED ORDER — CEFAZOLIN SODIUM-DEXTROSE 2-4 GM/100ML-% IV SOLN
2.0000 g | INTRAVENOUS | Status: AC
  Administered 2016-01-07: 2 g via INTRAVENOUS

## 2016-01-07 MED ORDER — OXYCODONE-ACETAMINOPHEN 5-325 MG PO TABS
1.0000 | ORAL_TABLET | ORAL | Status: DC | PRN
  Administered 2016-01-08 – 2016-01-09 (×3): 1 via ORAL
  Filled 2016-01-07 (×3): qty 1

## 2016-01-07 MED ORDER — NALBUPHINE HCL 10 MG/ML IJ SOLN
5.0000 mg | INTRAMUSCULAR | Status: DC | PRN
  Administered 2016-01-07: 5 mg via INTRAVENOUS

## 2016-01-07 MED ORDER — NALOXONE HCL 2 MG/2ML IJ SOSY
1.0000 ug/kg/h | PREFILLED_SYRINGE | INTRAMUSCULAR | Status: DC | PRN
  Filled 2016-01-07: qty 2

## 2016-01-07 MED ORDER — SODIUM CHLORIDE 0.9 % IR SOLN
Status: DC | PRN
  Administered 2016-01-07: 1000 mL

## 2016-01-07 MED ORDER — MEDROXYPROGESTERONE ACETATE 150 MG/ML IM SUSP: 150.0000 mg | INTRAMUSCULAR | Status: DC | PRN

## 2016-01-07 MED ORDER — PRENATAL MULTIVITAMIN CH
1.0000 | ORAL_TABLET | Freq: Every day | ORAL | Status: DC
  Administered 2016-01-09: 1 via ORAL
  Filled 2016-01-07 (×2): qty 1

## 2016-01-07 MED ORDER — NALBUPHINE HCL 10 MG/ML IJ SOLN: 5.0000 mg | INTRAMUSCULAR | Status: DC | PRN

## 2016-01-07 MED ORDER — LACTATED RINGERS IV SOLN
Freq: Once | INTRAVENOUS | Status: AC
  Administered 2016-01-07: 07:00:00 via INTRAVENOUS

## 2016-01-07 MED ORDER — IBUPROFEN 600 MG PO TABS
600.0000 mg | ORAL_TABLET | Freq: Four times a day (QID) | ORAL | Status: DC
  Administered 2016-01-07 – 2016-01-09 (×8): 600 mg via ORAL
  Filled 2016-01-07 (×9): qty 1

## 2016-01-07 MED ORDER — LACTATED RINGERS IV SOLN
INTRAVENOUS | Status: DC | PRN
  Administered 2016-01-07: 40 [IU] via INTRAVENOUS

## 2016-01-07 MED ORDER — NALBUPHINE HCL 10 MG/ML IJ SOLN
INTRAMUSCULAR | Status: AC
  Filled 2016-01-07: qty 1

## 2016-01-07 MED ORDER — SCOPOLAMINE 1 MG/3DAYS TD PT72
1.0000 | MEDICATED_PATCH | Freq: Once | TRANSDERMAL | Status: DC
  Filled 2016-01-07: qty 1

## 2016-01-07 MED ORDER — DIPHENHYDRAMINE HCL 25 MG PO CAPS: 25.0000 mg | ORAL_CAPSULE | Freq: Four times a day (QID) | ORAL | Status: DC | PRN

## 2016-01-07 MED ORDER — KETOROLAC TROMETHAMINE 30 MG/ML IJ SOLN
INTRAMUSCULAR | Status: AC
  Filled 2016-01-07: qty 1

## 2016-01-07 MED ORDER — SIMETHICONE 80 MG PO CHEW
80.0000 mg | CHEWABLE_TABLET | Freq: Three times a day (TID) | ORAL | Status: DC
  Administered 2016-01-07 – 2016-01-09 (×5): 80 mg via ORAL
  Filled 2016-01-07 (×7): qty 1

## 2016-01-07 MED ORDER — FENTANYL CITRATE (PF) 100 MCG/2ML IJ SOLN
INTRAMUSCULAR | Status: AC
  Filled 2016-01-07: qty 2

## 2016-01-07 MED ORDER — MEPERIDINE HCL 25 MG/ML IJ SOLN: 6.2500 mg | INTRAMUSCULAR | Status: DC | PRN

## 2016-01-07 MED ORDER — OXYCODONE-ACETAMINOPHEN 5-325 MG PO TABS: 2.0000 | ORAL_TABLET | ORAL | Status: DC | PRN

## 2016-01-07 MED ORDER — MORPHINE SULFATE-NACL 0.5-0.9 MG/ML-% IV SOSY
PREFILLED_SYRINGE | INTRAVENOUS | Status: AC
  Filled 2016-01-07: qty 1

## 2016-01-07 MED ORDER — DIPHENHYDRAMINE HCL 25 MG PO CAPS: 25.0000 mg | ORAL_CAPSULE | ORAL | Status: DC | PRN

## 2016-01-07 MED ORDER — SCOPOLAMINE 1 MG/3DAYS TD PT72
1.0000 | MEDICATED_PATCH | Freq: Once | TRANSDERMAL | Status: DC
  Administered 2016-01-07: 1.5 mg via TRANSDERMAL

## 2016-01-07 MED ORDER — SODIUM CHLORIDE 0.9% FLUSH: 3.0000 mL | INTRAVENOUS | Status: DC | PRN

## 2016-01-07 MED ORDER — TETANUS-DIPHTH-ACELL PERTUSSIS 5-2.5-18.5 LF-MCG/0.5 IM SUSP: 0.5000 mL | Freq: Once | INTRAMUSCULAR | Status: DC

## 2016-01-07 MED ORDER — OXYTOCIN 10 UNIT/ML IJ SOLN
INTRAMUSCULAR | Status: AC
  Filled 2016-01-07: qty 4

## 2016-01-07 SURGICAL SUPPLY — 28 items
CHLORAPREP W/TINT 26ML (MISCELLANEOUS) ×3 IMPLANT
CLAMP CORD UMBIL (MISCELLANEOUS) ×3 IMPLANT
CLOTH BEACON ORANGE TIMEOUT ST (SAFETY) ×3 IMPLANT
DRSG OPSITE POSTOP 4X10 (GAUZE/BANDAGES/DRESSINGS) ×3 IMPLANT
ELECT REM PT RETURN 9FT ADLT (ELECTROSURGICAL) ×3
ELECTRODE REM PT RTRN 9FT ADLT (ELECTROSURGICAL) ×1 IMPLANT
EXTRACTOR VACUUM M CUP 4 TUBE (SUCTIONS) IMPLANT
EXTRACTOR VACUUM M CUP 4' TUBE (SUCTIONS)
GLOVE BIO SURGEON STRL SZ 6.5 (GLOVE) ×2 IMPLANT
GLOVE BIO SURGEONS STRL SZ 6.5 (GLOVE) ×1
GLOVE BIOGEL PI IND STRL 7.0 (GLOVE) ×2 IMPLANT
GLOVE BIOGEL PI INDICATOR 7.0 (GLOVE) ×4
GOWN STRL REUS W/TWL LRG LVL3 (GOWN DISPOSABLE) ×6 IMPLANT
KIT ABG SYR 3ML LUER SLIP (SYRINGE) IMPLANT
LIQUID BAND (GAUZE/BANDAGES/DRESSINGS) ×3 IMPLANT
NEEDLE HYPO 25X5/8 SAFETYGLIDE (NEEDLE) IMPLANT
NS IRRIG 1000ML POUR BTL (IV SOLUTION) ×3 IMPLANT
PACK C SECTION WH (CUSTOM PROCEDURE TRAY) ×3 IMPLANT
PAD OB MATERNITY 4.3X12.25 (PERSONAL CARE ITEMS) ×3 IMPLANT
PENCIL SMOKE EVAC W/HOLSTER (ELECTROSURGICAL) ×3 IMPLANT
SUT CHROMIC 0 CT 802H (SUTURE) IMPLANT
SUT CHROMIC 0 CTX 36 (SUTURE) ×9 IMPLANT
SUT MON AB-0 CT1 36 (SUTURE) ×3 IMPLANT
SUT PDS AB 0 CTX 60 (SUTURE) ×3 IMPLANT
SUT PLAIN 0 NONE (SUTURE) IMPLANT
SUT VIC AB 4-0 KS 27 (SUTURE) IMPLANT
TOWEL OR 17X24 6PK STRL BLUE (TOWEL DISPOSABLE) ×3 IMPLANT
TRAY FOLEY CATH SILVER 14FR (SET/KITS/TRAYS/PACK) IMPLANT

## 2016-01-07 NOTE — Transfer of Care (Signed)
Immediate Anesthesia Transfer of Care Note  Patient: Jenna Chung  Procedure(s) Performed: Procedure(s) with comments: REPEAT CESAREAN SECTION WITH BILATERAL TUBAL LIGATION (Bilateral) - Repeat edc 01/14/16 allergic to quinolones Needs RNFA  Patient Location: PACU  Anesthesia Type:Spinal  Level of Consciousness: awake, alert , oriented and patient cooperative  Airway & Oxygen Therapy: Patient Spontanous Breathing  Post-op Assessment: Report given to RN and Post -op Vital signs reviewed and stable  Post vital signs: Reviewed and stable  Last Vitals:  Vitals:   01/07/16 0624  BP: 104/74  Pulse: 79  Resp: 16  Temp: 36.4 C    Last Pain:  Vitals:   01/07/16 0624  TempSrc: Oral      Patients Stated Pain Goal: 4 (01/07/16 [id number])  Complications: No apparent anesthesia complications

## 2016-01-07 NOTE — Anesthesia Procedure Notes (Signed)
Spinal  Patient location during procedure: OR Staffing Anesthesiologist: Cristela BlueJACKSON, Lawsen Arnott Preanesthetic Checklist Completed: patient identified, site marked, surgical consent, pre-op evaluation, timeout performed, IV checked, risks and benefits discussed and monitors and equipment checked Spinal Block Patient position: sitting Prep: DuraPrep Patient monitoring: cardiac monitor, continuous pulse ox, blood pressure and heart rate Approach: midline Location: L3-4 Injection technique: catheter Needle Needle type: Tuohy and Sprotte  Needle gauge: 24 G Needle length: 12.7 cm Needle insertion depth: 4 cm Catheter type: closed end flexible Catheter size: 19 g Catheter at skin depth: 10 cm Assessment Sensory level: T4 Additional Notes Spinal Dosage in OR  Bupivicaine ml       1.5 PFMS04   mcg        100 Fentanyl mcg            25   (-) allis to T8 after 8min

## 2016-01-07 NOTE — Op Note (Signed)
Cesarean Section Procedure Note   Jenna Chung  01/07/2016  Indications: Scheduled Proceedure/Maternal Request   Pre-operative Diagnosis: previous X 3, desires sterility.   Post-operative Diagnosis: Same   Surgeon: Surgeon(s) and Role:    * Zelphia CairoGretchen Verity Gilcrest, MD - Primary   Assistants: none  Anesthesia: epidural, spinal   Procedure Details:  The patient was seen in the Holding Room. The risks, benefits, complications, treatment options, and expected outcomes were discussed with the patient. The patient concurred with the proposed plan, giving informed consent. identified as Jenna Chung and the procedure verified as C-Section Delivery. A Time Out was held and the above information confirmed.  After induction of anesthesia, the patient was draped and prepped in the usual sterile manner. A transverse was made and carried down through the subcutaneous tissue to the fascia. Fascial incision was made and extended transversely. The fascia was separated from the underlying rectus tissue superiorly and inferiorly. The peritoneum was identified and entered. Peritoneal incision was extended longitudinally. The utero-vesical peritoneal reflection was incised transversely and the bladder flap was bluntly freed from the lower uterine segment. A low transverse uterine incision was made. Delivered from cephalic presentation was a viable infant with Apgar scores of 8 at one minute and 9 at five minutes. Cord ph was sent the umbilical cord was clamped and cut cord blood was obtained for evaluation. The placenta was removed Intact and appeared normal. The uterine outline, tubes and ovaries appeared normal}. The uterine incision was closed with running locked sutures of 0chromic gut.   Hemostasis was observed. Lavage was carried out until clear.   Bilateral tubes were grasped with a babcock and double tied with plain gut suture.  Segment of tube excised & passed off.  Hemostasis observed.  The fascia was  then reapproximated with running sutures of 0PDS. Marland Kitchen. The skin was closed with 4-0Vicryl.   Instrument, sponge, and needle counts were correct prior the abdominal closure and were correct at the conclusion of the case.    Findings:   Estimated Blood Loss: 500cc  Urine Output: clear  Specimens: segments of bilateral tubes to path, placenta to path   Complications: no complications  Disposition: PACU - hemodynamically stable.   Maternal Condition: stable   Baby condition / location:  Couplet care / Skin to Skin  Attending Attestation: I was present and scrubbed for the entire procedure.   Signed: Surgeon(s): Zelphia CairoGretchen Aoi Kouns, MD

## 2016-01-07 NOTE — Lactation Note (Signed)
This note was copied from a baby's chart. Lactation Consultation Note  Patient Name: Jenna Chung ZOXWR'UToday's Date: 01/07/2016 Reason for consult: Initial assessment  Initial visit at 8 hours of life. Mom is a P4 who nursed her previous children for 9 months; 2 years; & 14 months, respectively. Mom's oldest child is 37 yo & her youngest child is 37 yo.   Mom has no questions at this time. Mom reminded of initial newborn feeding behavior. Mom made aware of O/P services, breastfeeding support groups, community resources, and our phone # for post-discharge questions.   Mom has my # to call for assist, if desired.   Lurline HareRichey, Labrandon Knoch [facility] 01/07/2016, 4:11 PM

## 2016-01-07 NOTE — Anesthesia Postprocedure Evaluation (Signed)
Anesthesia Post Note  Patient: Jenna Chung  Procedure(s) Performed: Procedure(s) (LRB): REPEAT CESAREAN SECTION WITH BILATERAL TUBAL LIGATION (Bilateral)  Patient location during evaluation: Mother Baby Anesthesia Type: Spinal Level of consciousness: oriented and awake and alert Pain management: pain level controlled Vital Signs Assessment: post-procedure vital signs reviewed and stable Respiratory status: spontaneous breathing and respiratory function stable Cardiovascular status: blood pressure returned to baseline and stable Postop Assessment: no headache and no backache Anesthetic complications: no     Last Vitals:  Vitals:   01/07/16 1300 01/07/16 1430  BP: (!) 102/58 (!) 90/53  Pulse: (!) 59 (!) 55  Resp: 18 18  Temp: 37 C 36.8 C    Last Pain:  Vitals:   01/07/16 1431  TempSrc:   PainSc: 2    Pain Goal: Patients Stated Pain Goal: 2 (01/07/16 1030)               Khayla Koppenhaver

## 2016-01-07 NOTE — Progress Notes (Signed)
Epidural catheter removed at 0953. Tip intact

## 2016-01-08 LAB — CBC
HEMATOCRIT: 31.1 % — AB (ref 36.0–46.0)
HEMOGLOBIN: 10.5 g/dL — AB (ref 12.0–15.0)
MCH: 28.8 pg (ref 26.0–34.0)
MCHC: 33.8 g/dL (ref 30.0–36.0)
MCV: 85.2 fL (ref 78.0–100.0)
PLATELETS: 227 10*3/uL (ref 150–400)
RBC: 3.65 MIL/uL — AB (ref 3.87–5.11)
RDW: 14.2 % (ref 11.5–15.5)
WBC: 12.3 10*3/uL — AB (ref 4.0–10.5)

## 2016-01-08 LAB — BIRTH TISSUE RECOVERY COLLECTION (PLACENTA DONATION)

## 2016-01-08 MED ORDER — ONDANSETRON HCL 4 MG PO TABS
8.0000 mg | ORAL_TABLET | Freq: Three times a day (TID) | ORAL | Status: DC | PRN
  Administered 2016-01-08: 8 mg via ORAL
  Filled 2016-01-08: qty 2

## 2016-01-08 NOTE — Progress Notes (Signed)
No lunch yet due to c/o mild nausea. Dr Marcelle OverlieHolland notified and [facility] within 10 minutes. Changed IV Zofran order to 8mg  po. Will administer.

## 2016-01-08 NOTE — Lactation Note (Signed)
This note was copied from a baby's chart. Lactation Consultation Note  Patient Name: Jenna Chung Today's Date: 01/08/2016 Reason for consult: Initial assessment Baby at 33 hr of life. Experienced bf mom denies breast or nipple pain. Mom stated baby just came off the breast as lactation entered the room. Mom stated that over night baby had a hard time latching to the R breast and now the R nipple is sore. No skin break down was noted at this visit. Mom requested a Harmony for use until she can pick up her DEBP from insurance. Discussed baby behavior, feeding frequency, baby belly size, voids, wt loss, breast changes, and nipple care. Mom stated that she can manually express and has spoon in room. Mom will offer the breast 8+/24hr on demand and f/u bf with manual expression and spoon feeding. Lactation number is on the board for mom to call at next feeding. She is aware of lactation services and support group.    Maternal Data    Feeding Feeding Type: Breast Fed Length of feed: 20 min  LATCH Score/Interventions Latch: Grasps breast easily, tongue down, lips flanged, rhythmical sucking.  Audible Swallowing: Spontaneous and intermittent  Type of Nipple: Everted at rest and after stimulation  Comfort (Breast/Nipple): Soft / non-tender     Hold (Positioning): No assistance needed to correctly position infant at breast.  LATCH Score: 10  Lactation Tools Discussed/Used [facility]: No Pump Review: Setup, frequency, and cleaning;Milk Storage Initiated by:: ES Date initiated:: 01/08/16   Consult Status Consult Status: Follow-up Date: 01/09/16 Follow-up type: In-patient    [name] 01/08/2016, 5:22 PM

## 2016-01-08 NOTE — Progress Notes (Signed)
Subjective: Postpartum Day 1: Cesarean Delivery Patient reports tolerating PO, + flatus and no problems voiding.    Objective: Vital signs in last 24 hours: Temp:  [97.3 F (36.3 C)-98.6 F (37 C)] 98.1 F (36.7 C) (10/27 0345) Pulse Rate:  [51-84] 51 (10/27 0345) Resp:  [12-30] 18 (10/27 0345) BP: (88-102)/(48-75) 101/50 (10/27 0345) SpO2:  [96 %-100 %] 98 % (10/27 0345)  Physical Exam:  General: alert and cooperative Lochia: appropriate Uterine Fundus: firm Incision: healing well DVT Evaluation: No evidence of DVT seen on physical exam. Negative Homan's sign. No cords or calf tenderness. No significant calf/ankle edema.   Recent Labs  01/05/16 1135 01/08/16 0551  HGB 11.2* 10.5*  HCT 32.7* 31.1*    Assessment/Plan: Status post Cesarean section. Doing well postoperatively.  Continue current care.  Jenna Chung G 01/08/2016, 8:21 AM

## 2016-01-09 ENCOUNTER — Ambulatory Visit: Payer: Self-pay

## 2016-01-09 MED ORDER — IBUPROFEN 600 MG PO TABS: 600.0000 mg | ORAL_TABLET | Freq: Four times a day (QID) | ORAL | 0 refills | Status: AC

## 2016-01-09 MED ORDER — OXYCODONE-ACETAMINOPHEN 5-325 MG PO TABS: 1.0000 | ORAL_TABLET | ORAL | 0 refills | Status: AC | PRN

## 2016-01-09 NOTE — Lactation Note (Signed)
This note was copied from a baby's chart. Lactation Consultation Note Experienced BF mom states BF going well. Mom BF her 654 yr old 3722 months then her 37 yr old for 1714 month. Concerned about nipples being to large. Mom has large everted nipples, baby's mouth isn't that small. Appears to be getting plenty of transfer of colostrum d/t 9 voids, 5 stools w/7% weight loss. LC feels large output has factor in weight loss. Mom hand expressed easy flow of colostrum. Mom breast are filling. Some knots noted. Encouraged to massage during feeding, post pump to empty breast. Nipples dry, slightly stuck to moms top. Application of coconut oil helpful w/instructions of nipple care. Education of breast care, engorgement, filling, STS, I&O.  Strongly encouraged not to let breast get engorged. Reminded of LC out pt. Services and support group.  Patient Name: Jenna Chung WGNFA'OToday's Date: 01/09/2016 Reason for consult: Follow-up assessment   Maternal Data    Feeding Feeding Type: Breast Fed Length of feed: 10 min  LATCH Score/Interventions          Comfort (Breast/Nipple): Soft / non-tender           Lactation Tools Discussed/Used     Consult Status Consult Status: Complete Date: 01/09/16    Charyl DancerCARVER, Nalaysia Manganiello G 01/09/2016, 1:28 PM

## 2016-01-09 NOTE — Discharge Summary (Signed)
Obstetric Discharge Summary Reason for Admission: cesarean section Prenatal Procedures: none Intrapartum Procedures: cesarean: low cervical, transverse and tubal ligation Postpartum Procedures: none Complications-Operative and Postpartum: none Hemoglobin  Date Value Ref Range Status  01/08/2016 10.5 (L) 12.0 - 15.0 g/dL Final   HCT  Date Value Ref Range Status  01/08/2016 31.1 (L) 36.0 - 46.0 % Final    Physical Exam:  General: alert Lochia: appropriate Uterine Fundus: firm Incision: healing well DVT Evaluation: No evidence of DVT seen on physical exam.  Discharge Diagnoses: Term Pregnancy-delivered  Discharge Information: Date: 01/09/2016 Activity: pelvic rest Diet: routine Medications: PNV, Ibuprofen and Percocet Condition: stable Instructions: refer to practice specific booklet Discharge to: home Follow-up Information    [facility]. Schedule an appointment as soon as possible for a visit in 1 week(s).   Contact information: [address] [place] [address] [phone]           Newborn Data: Live born female  Birth Weight: 7 lb 10.1 oz (3460 g) APGAR: 8, 9  Home with mother.  Jenna Chung,Jenna Chung 01/09/2016, 8:48 AM

## 2020-09-03 ENCOUNTER — Other Ambulatory Visit: Payer: Self-pay | Admitting: Obstetrics and Gynecology

## 2020-09-03 DIAGNOSIS — R928 Other abnormal and inconclusive findings on diagnostic imaging of breast: Secondary | ICD-10-CM

## 2020-09-22 ENCOUNTER — Other Ambulatory Visit: Payer: Self-pay | Admitting: Obstetrics and Gynecology

## 2020-09-22 ENCOUNTER — Ambulatory Visit
Admission: RE | Admit: 2020-09-22 | Discharge: 2020-09-22 | Disposition: A | Discharge status: Home / Self Care | Payer: PRIVATE HEALTH INSURANCE | Source: Ambulatory Visit | Attending: Obstetrics and Gynecology | Admitting: Obstetrics and Gynecology

## 2020-09-22 ENCOUNTER — Other Ambulatory Visit: Payer: Self-pay

## 2020-09-22 DIAGNOSIS — R928 Other abnormal and inconclusive findings on diagnostic imaging of breast: Secondary | ICD-10-CM

## 2020-09-22 DIAGNOSIS — R599 Enlarged lymph nodes, unspecified: Secondary | ICD-10-CM

## 2020-09-25 ENCOUNTER — Other Ambulatory Visit: Payer: Self-pay

## 2020-09-25 ENCOUNTER — Ambulatory Visit
Admission: RE | Admit: 2020-09-25 | Discharge: 2020-09-25 | Disposition: A | Discharge status: Home / Self Care | Payer: BLUE CROSS/BLUE SHIELD | Source: Ambulatory Visit | Attending: Obstetrics and Gynecology | Admitting: Obstetrics and Gynecology

## 2020-09-25 DIAGNOSIS — R599 Enlarged lymph nodes, unspecified: Secondary | ICD-10-CM

## 2023-05-02 IMAGING — US US AXILLARY LEFT
1 series · 13 of 13 positions shown · non-contrast
Comparison: Previous exam(s).

CLINICAL DATA: Patient with screening recall for left axillary
lymph nodes.

EXAM:
ULTRASOUND OF THE LEFT AXILLA

[Series 1: us axillary left · 0.06mm/px · 13 of 13 slices shown]
[im 1/13]
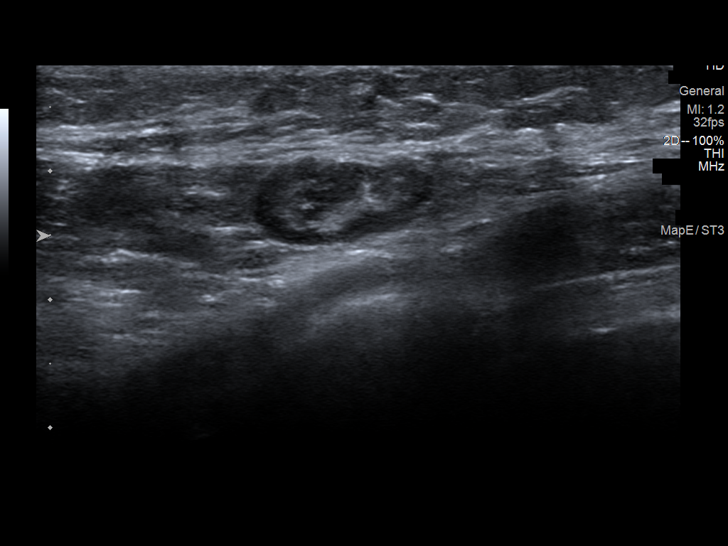
[im 2/13]
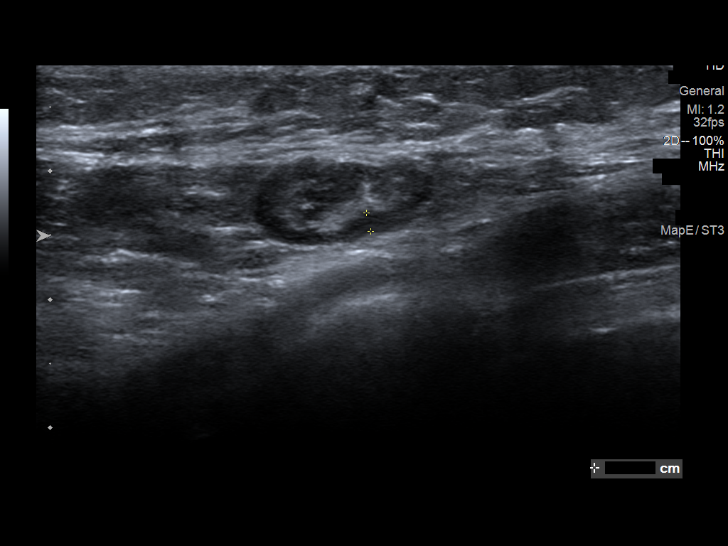
[im 3/13]
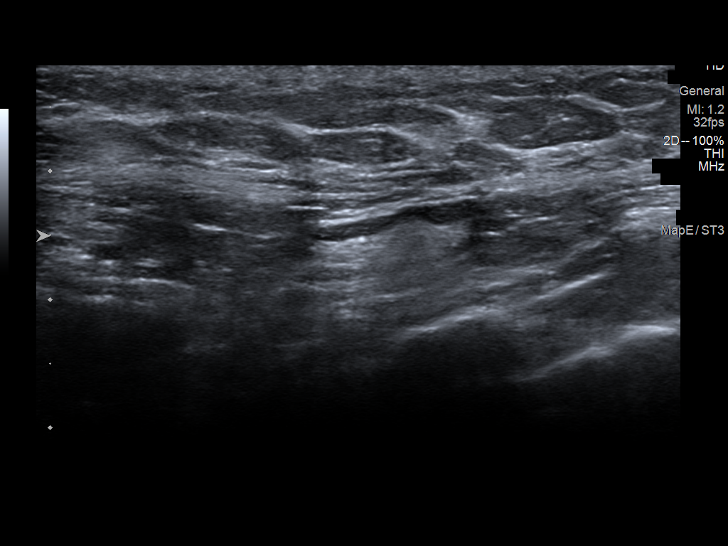
[im 4/13]
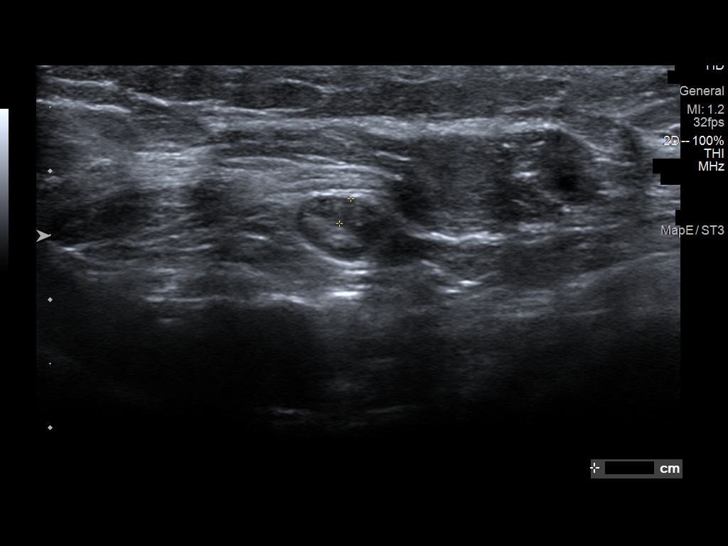
[im 5/13]
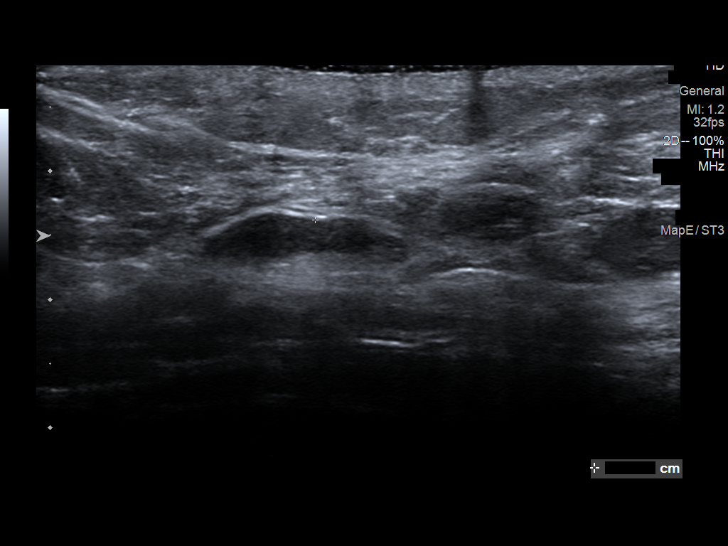
[im 6/13]
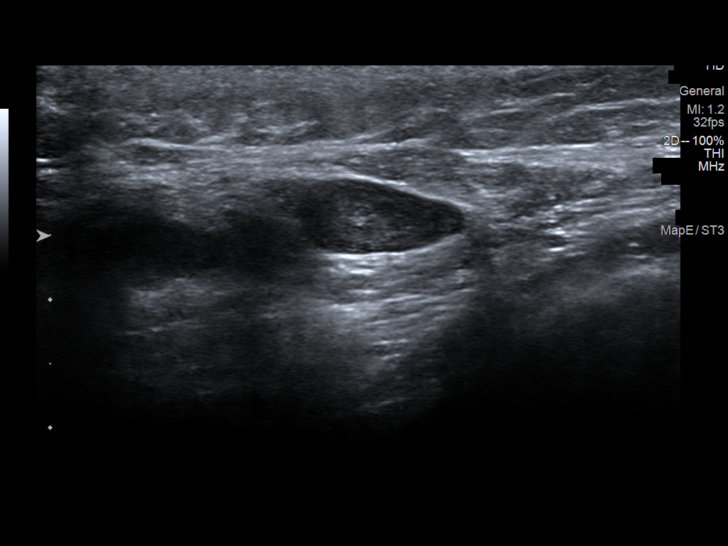
[im 7/13]
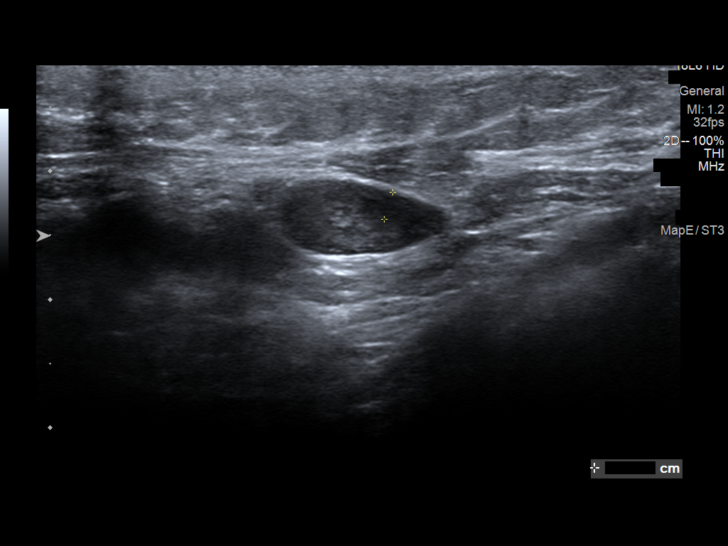
[im 8/13]
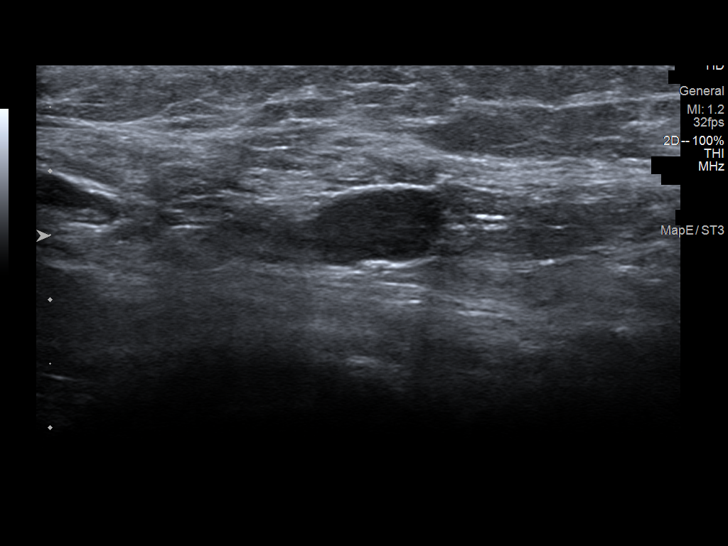
[im 9/13]
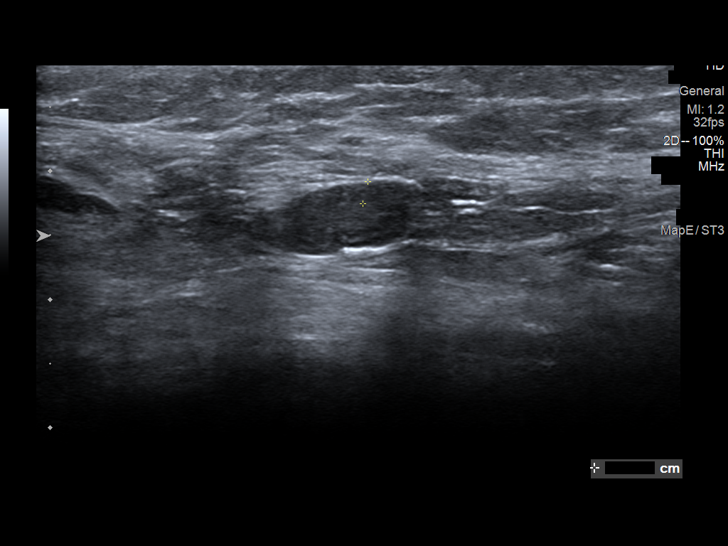
[im 10/13]
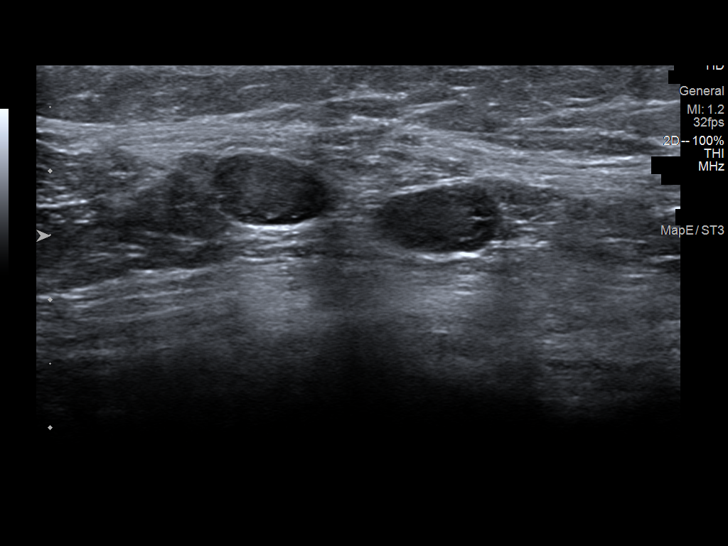
[im 11/13]
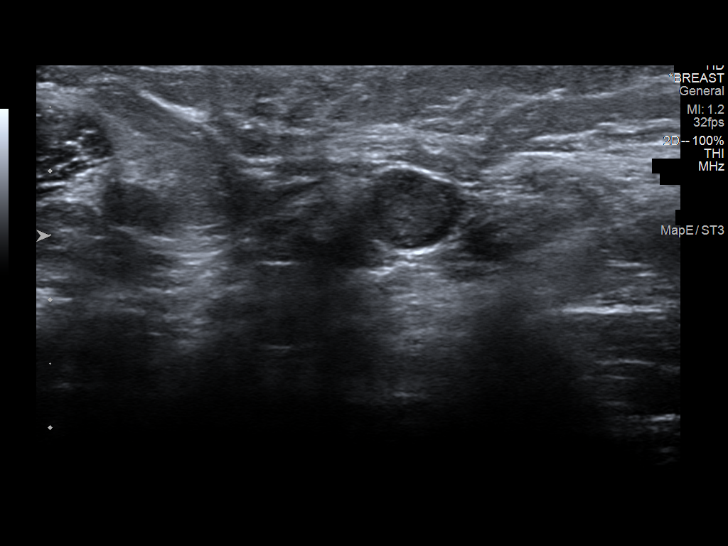
[im 12/13]
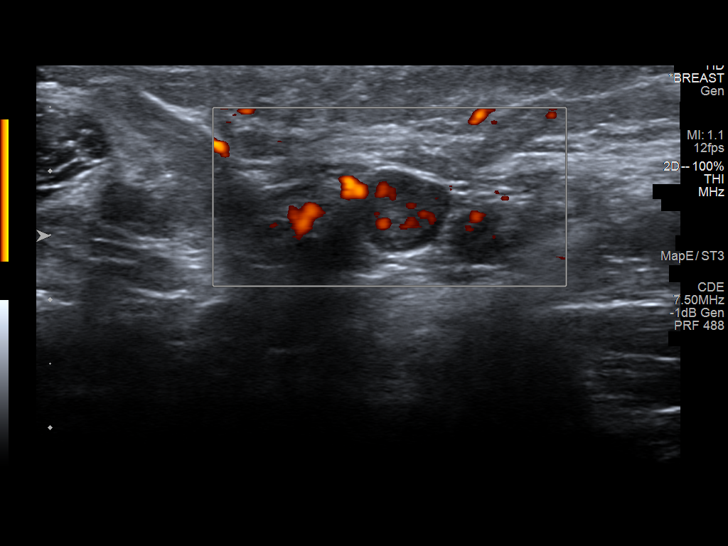
[im 13/13]
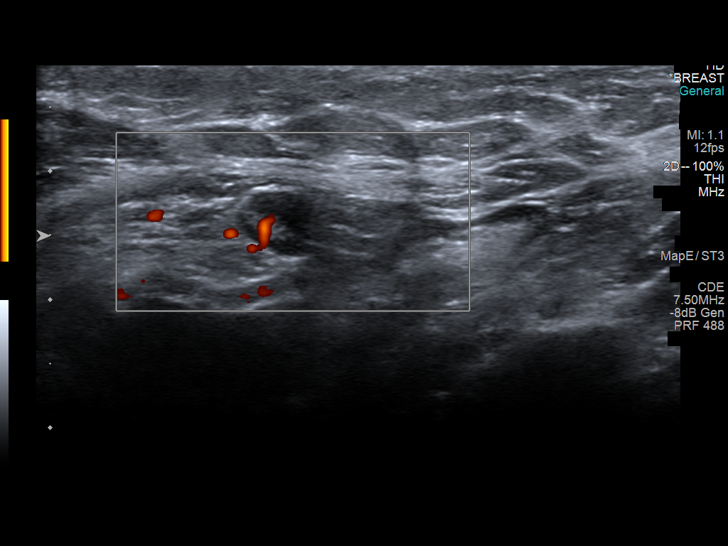

[13 of 13 positions shown; findings below may reference images not displayed]

FINDINGS: On physical exam,no discrete left axillary mass is palpated.

Ultrasound is performed, showing multiple prominent and mildly
thickened nodes within the left axilla. The thickest node measures
up to 5 mm in thickness.
IMPRESSION: Multiple prominent and cortically thickened nodes within the left
axilla.

RECOMMENDATION:
Ultrasound-guided core needle biopsy left axillary node.

I have discussed the findings and recommendations with the patient.
If applicable, a reminder letter will be sent to the patient
regarding the next appointment.

BI-RADS CATEGORY  4: Suspicious.

## 2023-05-05 IMAGING — US US AXILLARY NODE CORE BIOPSY LEFT
1 series · 12 of 14 positions shown · non-contrast
Comparison: Previous exam(s).
COMPARISON: Previous exam(s).

Addendum:
CLINICAL DATA: Biopsy of abnormal left axillary lymph nodes.

EXAM:
US AXILLARY NODE CORE BIOPSY LEFT

[Series 1: us axillary node core biopsy left · 0.07mm/px · 12 of 14 slices shown]
[im 1/14]
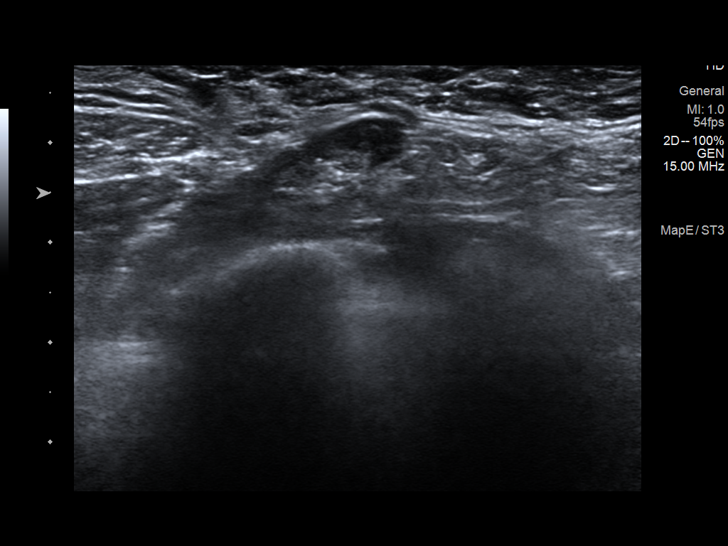
[im 2/14]
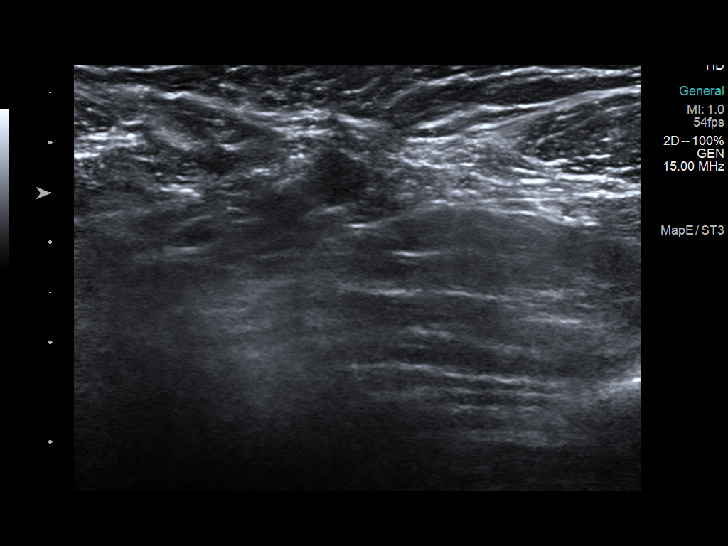
[im 3/14]
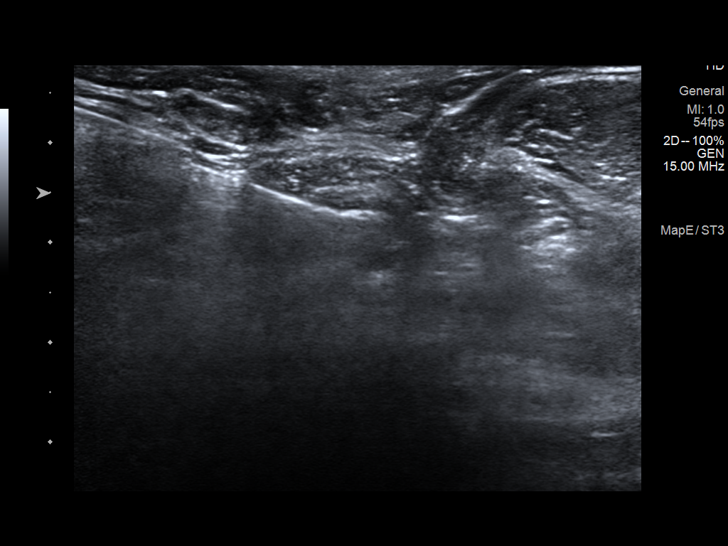
[im 5/14]
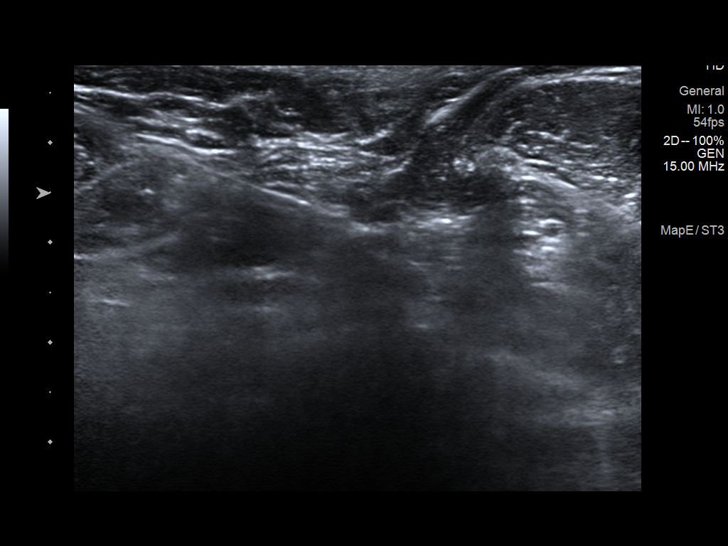
[im 6/14]
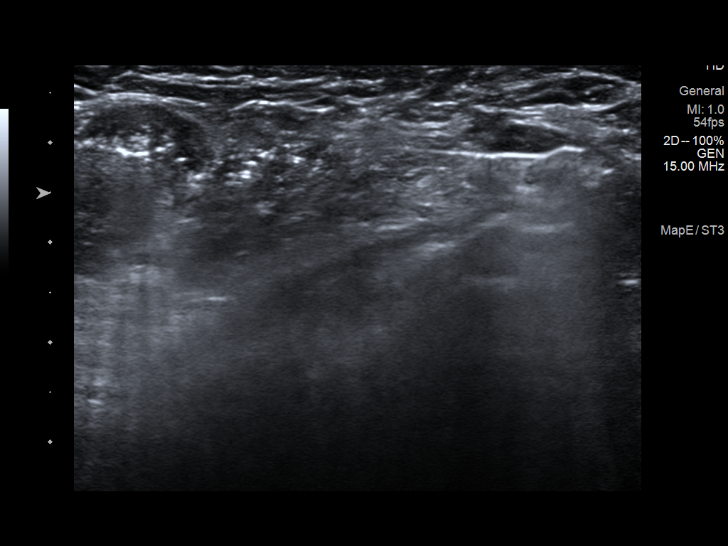
[im 7/14]
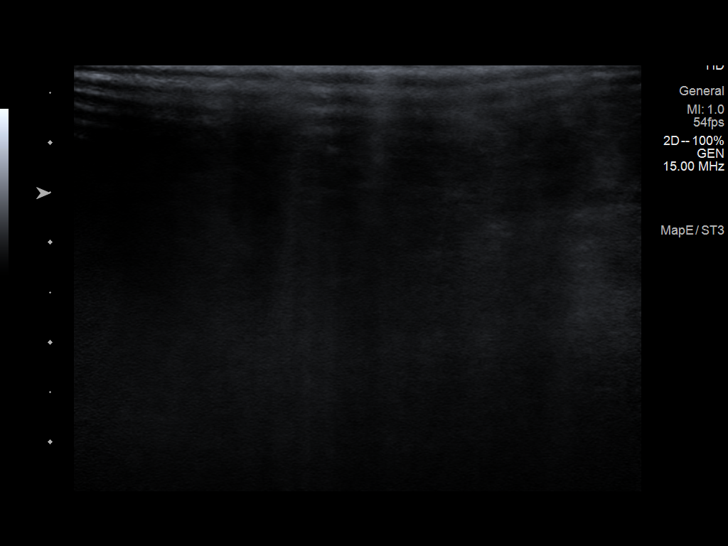
[im 8/14]
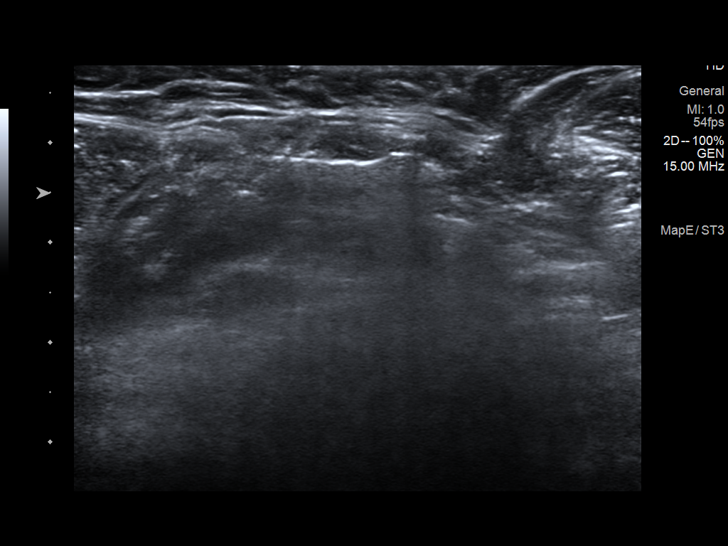
[im 9/14]
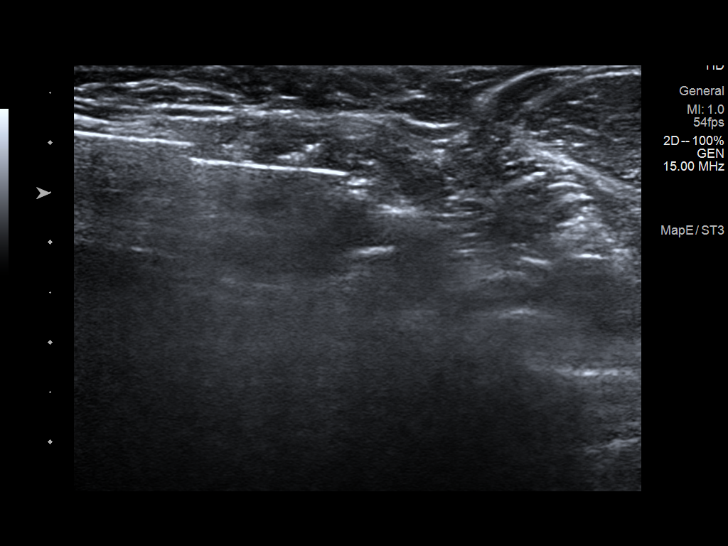
[im 10/14]
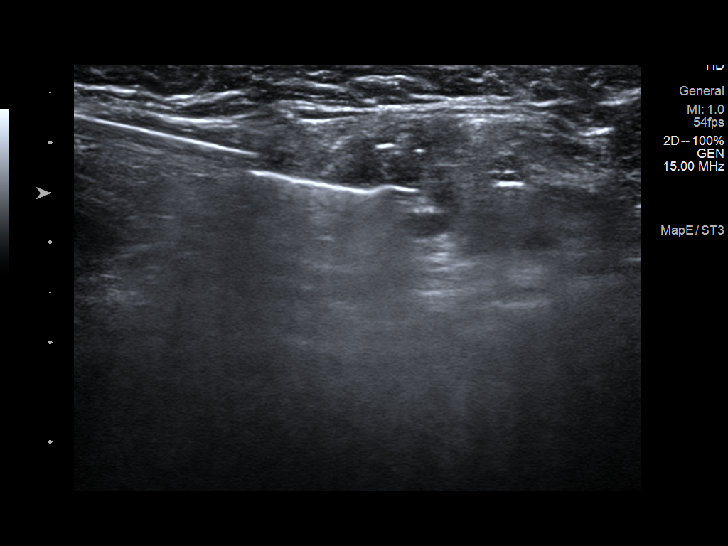
[im 12/14]
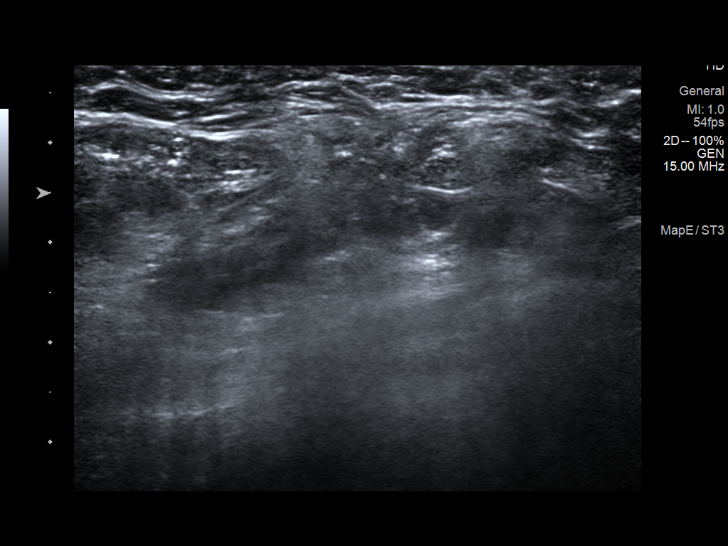
[im 13/14]
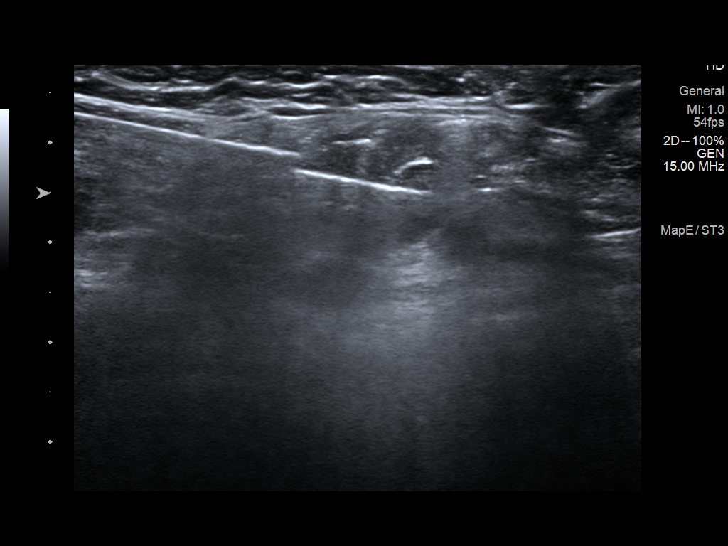
[im 14/14]
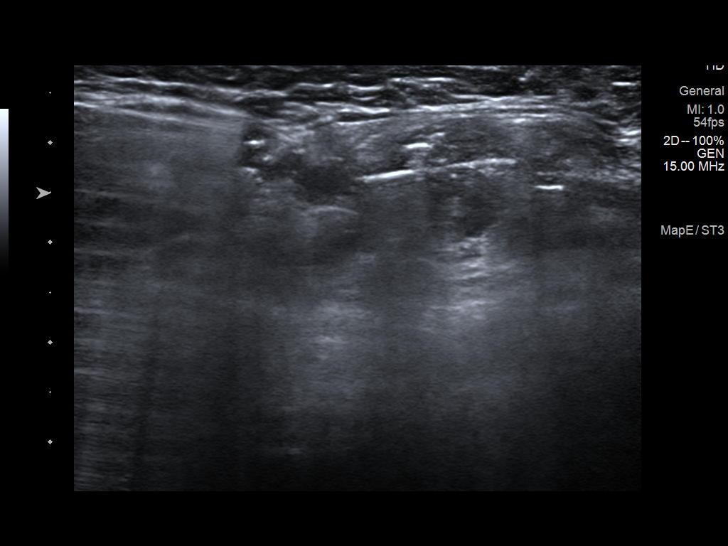

[12 of 14 positions shown; findings below may reference images not displayed]

PROCEDURE:
Abnormal left axillary lymph nodes remain during today's imaging.
However, they appear to be less numerous and less abnormal in
appearance.

I met with the patient and we discussed the procedure of
ultrasound-guided biopsy, including benefits and alternatives. We
discussed the high likelihood of a successful procedure. We
discussed the risks of the procedure, including infection, bleeding,
tissue injury, clip migration, and inadequate sampling. Informed
written consent was given. The usual time-out protocol was performed
immediately prior to the procedure.

Using sterile technique and 1% Lidocaine as local anesthetic, under
direct ultrasound visualization, a 14 gauge Wackstar device was
used to perform biopsy of a left axillary lymph node using a lateral
approach. At the conclusion of the procedure Keyli Tiger tissue
marker clip was deployed into the biopsy cavity. Follow up 2 view
mammogram was performed and dictated separately.
IMPRESSION: Ultrasound guided biopsy of a left axillary lymph node. No apparent
complications.

ADDENDUM:
Pathology revealed REACTIVE LYMPH NODE of the Left axilla. This was
found to be concordant by Dr. Thuthudonbe Sikey.

Pathology results were discussed with the patient by telephone. The
patient reported doing well after the biopsy with tenderness at the
site. Post biopsy instructions and care were reviewed and questions
were answered. The patient was encouraged to call The [REDACTED] for any additional concerns. My direct phone
number was provided.

The patient was instructed to continue with monthly self breast and
axillary examinations, clinical follow-up as needed, and to return
due in August 2021.

Pathology results reported by Marko Charlie, RN on 09/29/2020.

*** End of Addendum ***
PROCEDURE:
Abnormal left axillary lymph nodes remain during today's imaging.
However, they appear to be less numerous and less abnormal in
appearance.

I met with the patient and we discussed the procedure of
ultrasound-guided biopsy, including benefits and alternatives. We
discussed the high likelihood of a successful procedure. We
discussed the risks of the procedure, including infection, bleeding,
tissue injury, clip migration, and inadequate sampling. Informed
written consent was given. The usual time-out protocol was performed
immediately prior to the procedure.

Using sterile technique and 1% Lidocaine as local anesthetic, under
direct ultrasound visualization, a 14 gauge Wackstar device was
used to perform biopsy of a left axillary lymph node using a lateral
approach. At the conclusion of the procedure Keyli Tiger tissue
marker clip was deployed into the biopsy cavity. Follow up 2 view
mammogram was performed and dictated separately.
IMPRESSION: Ultrasound guided biopsy of a left axillary lymph node. No apparent
complications.
